# Patient Record
Sex: Male | Born: 1965 | Race: White | Hispanic: No | Marital: Married | Smoking: Former smoker
Health system: Southern US, Community
[De-identification: ages and names within clinical notes are randomized; demographics above are authoritative.]

## PROBLEM LIST (undated history)

## (undated) DIAGNOSIS — K219 Gastro-esophageal reflux disease without esophagitis: Secondary | ICD-10-CM

## (undated) DIAGNOSIS — Z8709 Personal history of other diseases of the respiratory system: Secondary | ICD-10-CM

## (undated) DIAGNOSIS — M1990 Unspecified osteoarthritis, unspecified site: Secondary | ICD-10-CM

## (undated) DIAGNOSIS — I2510 Atherosclerotic heart disease of native coronary artery without angina pectoris: Secondary | ICD-10-CM

## (undated) DIAGNOSIS — C8590 Non-Hodgkin lymphoma, unspecified, unspecified site: Secondary | ICD-10-CM

## (undated) DIAGNOSIS — M5136 Other intervertebral disc degeneration, lumbar region: Secondary | ICD-10-CM

## (undated) DIAGNOSIS — M5126 Other intervertebral disc displacement, lumbar region: Secondary | ICD-10-CM

## (undated) DIAGNOSIS — Z9221 Personal history of antineoplastic chemotherapy: Secondary | ICD-10-CM

## (undated) DIAGNOSIS — S069X9A Unspecified intracranial injury with loss of consciousness of unspecified duration, initial encounter: Secondary | ICD-10-CM

## (undated) DIAGNOSIS — I219 Acute myocardial infarction, unspecified: Secondary | ICD-10-CM

## (undated) DIAGNOSIS — S069XAA Unspecified intracranial injury with loss of consciousness status unknown, initial encounter: Secondary | ICD-10-CM

## (undated) DIAGNOSIS — R7303 Prediabetes: Secondary | ICD-10-CM

## (undated) DIAGNOSIS — E785 Hyperlipidemia, unspecified: Secondary | ICD-10-CM

## (undated) DIAGNOSIS — F419 Anxiety disorder, unspecified: Secondary | ICD-10-CM

## (undated) DIAGNOSIS — E039 Hypothyroidism, unspecified: Secondary | ICD-10-CM

## (undated) DIAGNOSIS — I10 Essential (primary) hypertension: Secondary | ICD-10-CM

## (undated) DIAGNOSIS — R200 Anesthesia of skin: Secondary | ICD-10-CM

## (undated) DIAGNOSIS — Z923 Personal history of irradiation: Secondary | ICD-10-CM

## (undated) DIAGNOSIS — C801 Malignant (primary) neoplasm, unspecified: Secondary | ICD-10-CM

## (undated) DIAGNOSIS — J449 Chronic obstructive pulmonary disease, unspecified: Secondary | ICD-10-CM

## (undated) DIAGNOSIS — M51369 Other intervertebral disc degeneration, lumbar region without mention of lumbar back pain or lower extremity pain: Secondary | ICD-10-CM

## (undated) HISTORY — PX: HERNIA REPAIR: SHX51

## (undated) HISTORY — PX: BACK SURGERY: SHX140

## (undated) HISTORY — PX: OTHER SURGICAL HISTORY: SHX169

---

## 1999-04-01 HISTORY — PX: LYMPH NODE BIOPSY: SHX201

## 2000-04-17 ENCOUNTER — Ambulatory Visit (HOSPITAL_COMMUNITY): Admission: RE | Admit: 2000-04-17 | Discharge: 2000-04-17 | Payer: Self-pay | Admitting: Oncology

## 2000-04-17 ENCOUNTER — Encounter: Payer: Self-pay | Admitting: Oncology

## 2000-04-21 ENCOUNTER — Encounter: Admission: RE | Admit: 2000-04-21 | Discharge: 2000-07-20 | Payer: Self-pay | Admitting: Radiation Oncology

## 2000-04-24 ENCOUNTER — Encounter (HOSPITAL_COMMUNITY): Admission: RE | Admit: 2000-04-24 | Discharge: 2000-07-23 | Payer: Self-pay | Admitting: Dentistry

## 2000-08-14 ENCOUNTER — Encounter: Payer: Self-pay | Admitting: Oncology

## 2000-08-14 ENCOUNTER — Ambulatory Visit (HOSPITAL_COMMUNITY): Admission: RE | Admit: 2000-08-14 | Discharge: 2000-08-14 | Payer: Self-pay | Admitting: Oncology

## 2000-11-11 ENCOUNTER — Encounter: Payer: Self-pay | Admitting: Oncology

## 2000-11-11 ENCOUNTER — Ambulatory Visit (HOSPITAL_COMMUNITY): Admission: RE | Admit: 2000-11-11 | Discharge: 2000-11-11 | Payer: Self-pay | Admitting: Oncology

## 2001-03-08 ENCOUNTER — Ambulatory Visit (HOSPITAL_COMMUNITY): Admission: RE | Admit: 2001-03-08 | Discharge: 2001-03-08 | Payer: Self-pay | Admitting: Oncology

## 2001-03-08 ENCOUNTER — Encounter: Payer: Self-pay | Admitting: Oncology

## 2001-09-24 ENCOUNTER — Encounter: Payer: Self-pay | Admitting: Oncology

## 2001-09-24 ENCOUNTER — Ambulatory Visit (HOSPITAL_COMMUNITY): Admission: RE | Admit: 2001-09-24 | Discharge: 2001-09-24 | Payer: Self-pay | Admitting: Oncology

## 2002-03-08 ENCOUNTER — Ambulatory Visit (HOSPITAL_COMMUNITY): Admission: RE | Admit: 2002-03-08 | Discharge: 2002-03-08 | Payer: Self-pay | Admitting: Hematology and Oncology

## 2002-03-08 ENCOUNTER — Encounter: Payer: Self-pay | Admitting: Hematology and Oncology

## 2002-08-23 ENCOUNTER — Ambulatory Visit (HOSPITAL_COMMUNITY): Admission: RE | Admit: 2002-08-23 | Discharge: 2002-08-23 | Payer: Self-pay | Admitting: *Deleted

## 2002-08-23 ENCOUNTER — Encounter: Payer: Self-pay | Admitting: *Deleted

## 2003-09-19 ENCOUNTER — Ambulatory Visit (HOSPITAL_COMMUNITY): Admission: RE | Admit: 2003-09-19 | Discharge: 2003-09-19 | Payer: Self-pay | Admitting: Hematology and Oncology

## 2008-12-26 ENCOUNTER — Ambulatory Visit (HOSPITAL_COMMUNITY): Admission: RE | Admit: 2008-12-26 | Discharge: 2008-12-26 | Payer: Self-pay | Admitting: Internal Medicine

## 2009-09-14 ENCOUNTER — Ambulatory Visit (HOSPITAL_COMMUNITY): Admission: RE | Admit: 2009-09-14 | Discharge: 2009-09-14 | Payer: Self-pay | Admitting: General Surgery

## 2010-06-16 LAB — BASIC METABOLIC PANEL
CO2: 25 mEq/L (ref 19–32)
Chloride: 106 mEq/L (ref 96–112)
Creatinine, Ser: 0.69 mg/dL (ref 0.4–1.5)
GFR calc non Af Amer: >60 mL/min (ref >60)
Sodium: 139 mEq/L (ref 135–145)

## 2010-06-16 LAB — CBC
HCT: 44.1 % (ref 39.0–52.0)
MCV: 90.1 fL (ref 78.0–100.0)
Platelets: 228 10*3/uL (ref 150–400)
RDW: 12.7 % (ref 11.5–15.5)
WBC: 8.8 10*3/uL (ref 4.0–10.5)

## 2010-06-16 LAB — SURGICAL PCR SCREEN: Staphylococcus aureus: NEGATIVE

## 2011-07-28 ENCOUNTER — Ambulatory Visit (HOSPITAL_COMMUNITY)
Admission: RE | Admit: 2011-07-28 | Discharge: 2011-07-28 | Disposition: A | Discharge status: Home / Self Care | Payer: Medicare Other | Source: Ambulatory Visit | Attending: Physical Medicine and Rehabilitation | Admitting: Physical Medicine and Rehabilitation

## 2011-07-28 ENCOUNTER — Other Ambulatory Visit (HOSPITAL_COMMUNITY): Payer: Self-pay | Admitting: Physical Medicine and Rehabilitation

## 2011-07-28 DIAGNOSIS — M5137 Other intervertebral disc degeneration, lumbosacral region: Secondary | ICD-10-CM | POA: Insufficient documentation

## 2011-07-28 DIAGNOSIS — M545 Low back pain, unspecified: Secondary | ICD-10-CM | POA: Insufficient documentation

## 2011-07-28 DIAGNOSIS — M79609 Pain in unspecified limb: Secondary | ICD-10-CM

## 2011-07-28 DIAGNOSIS — M51379 Other intervertebral disc degeneration, lumbosacral region without mention of lumbar back pain or lower extremity pain: Secondary | ICD-10-CM | POA: Insufficient documentation

## 2011-07-28 DIAGNOSIS — M47817 Spondylosis without myelopathy or radiculopathy, lumbosacral region: Secondary | ICD-10-CM

## 2011-07-28 IMAGING — CR DG LUMBAR SPINE COMPLETE 4+V
5 series · 5 of 5 positions shown · non-contrast
Comparison: None.

CLINICAL DATA: Low back pain, lifting injury

LUMBAR SPINE - COMPLETE 4+ VIEW

[view not recorded (1 of 5)]
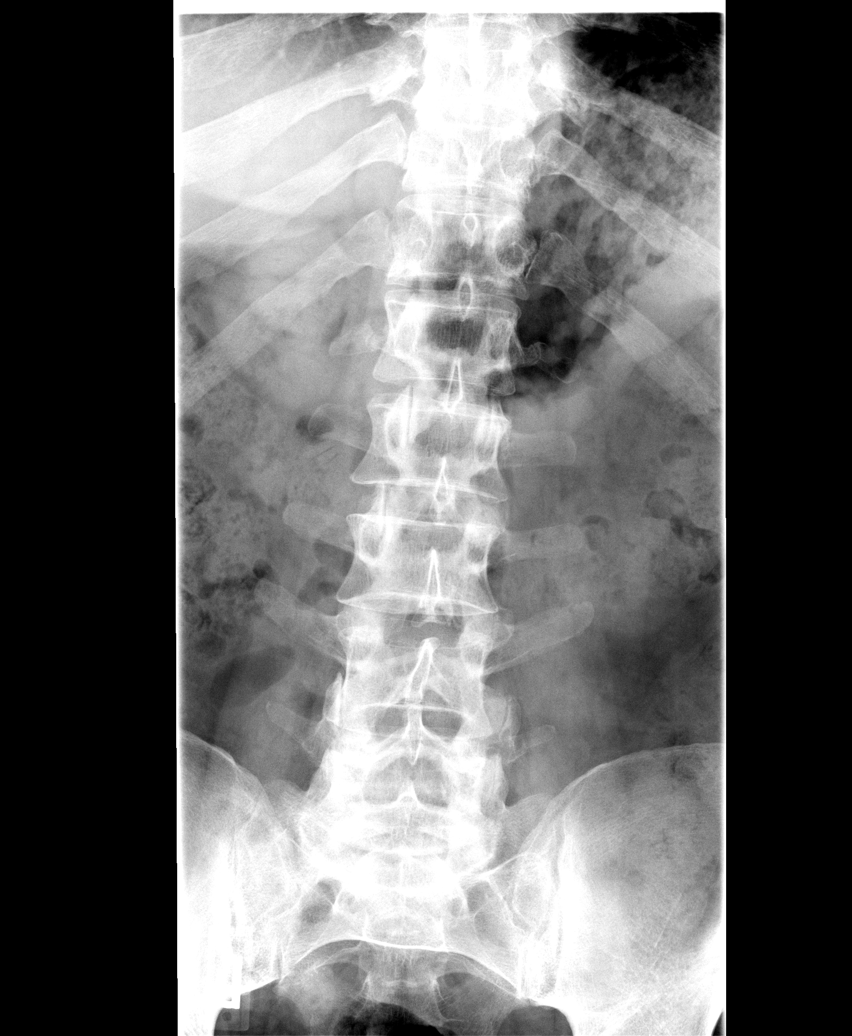

[view not recorded (2 of 5)]
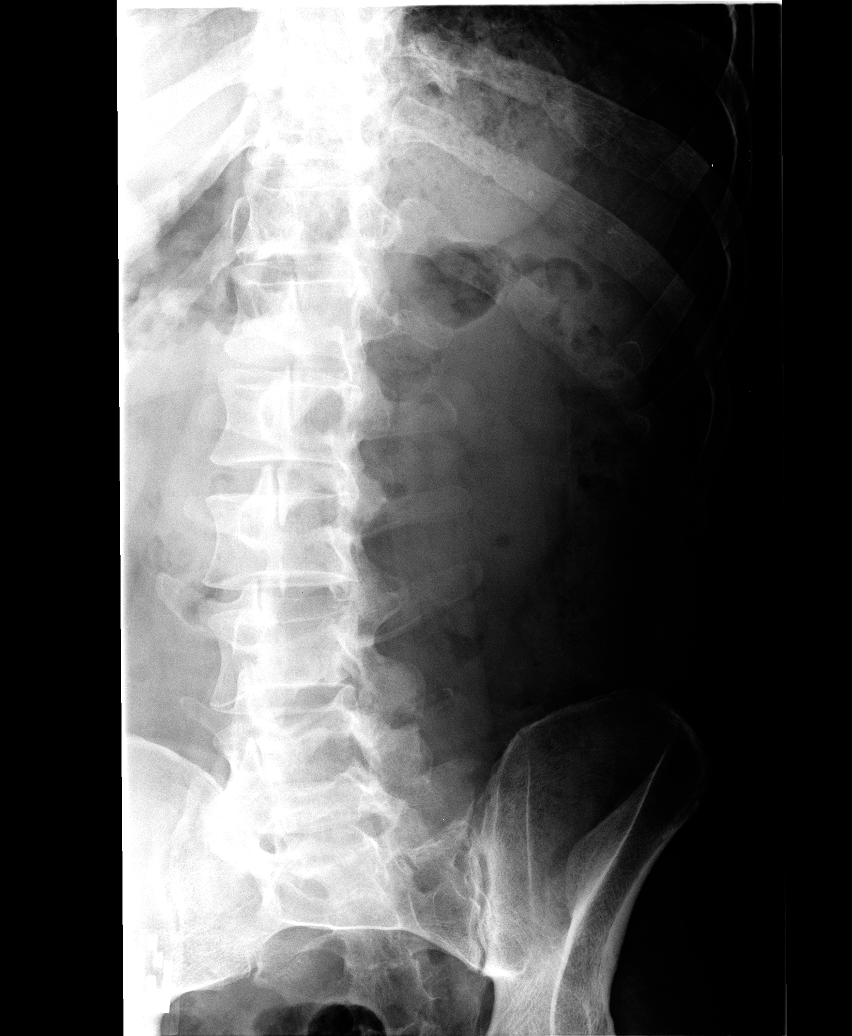

[view not recorded (3 of 5)]
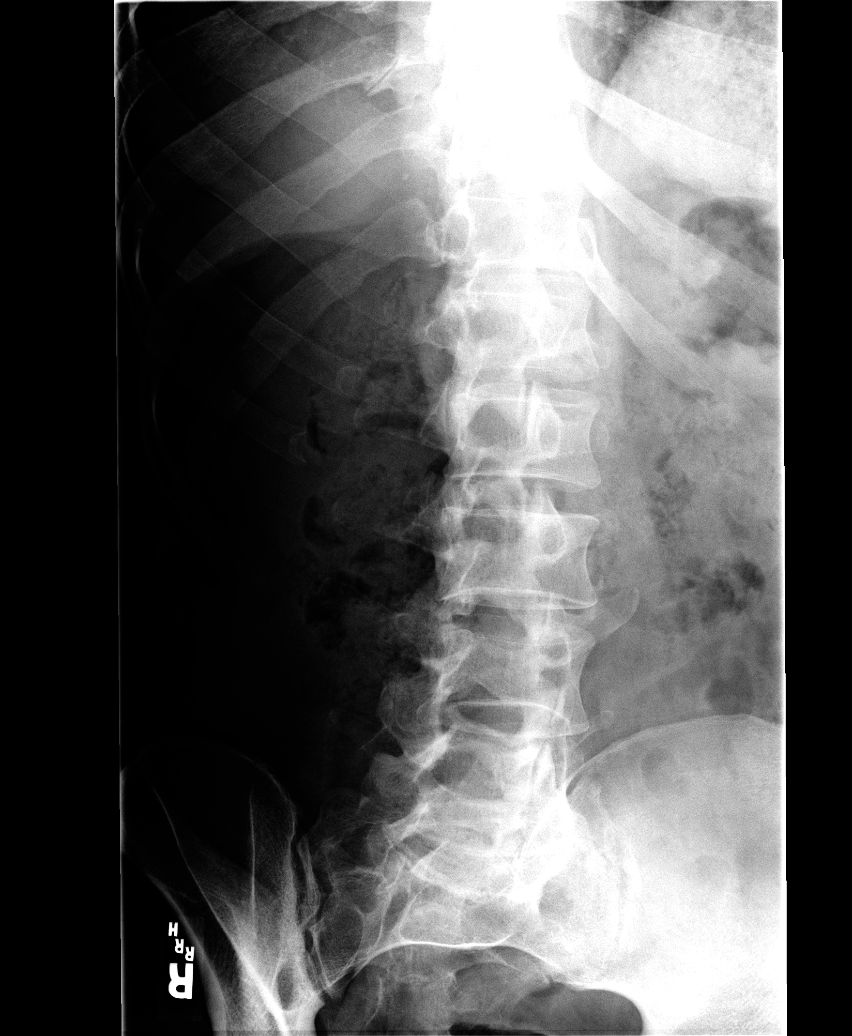

[view not recorded (4 of 5)]
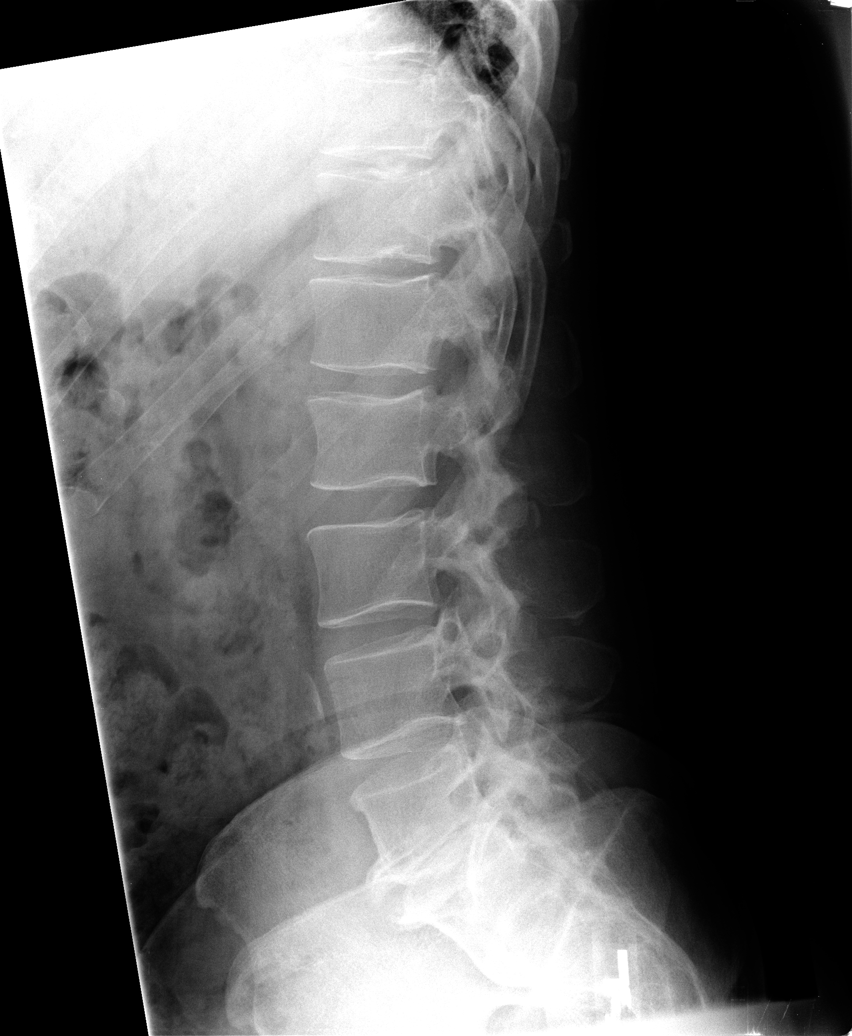

[view not recorded (5 of 5)]
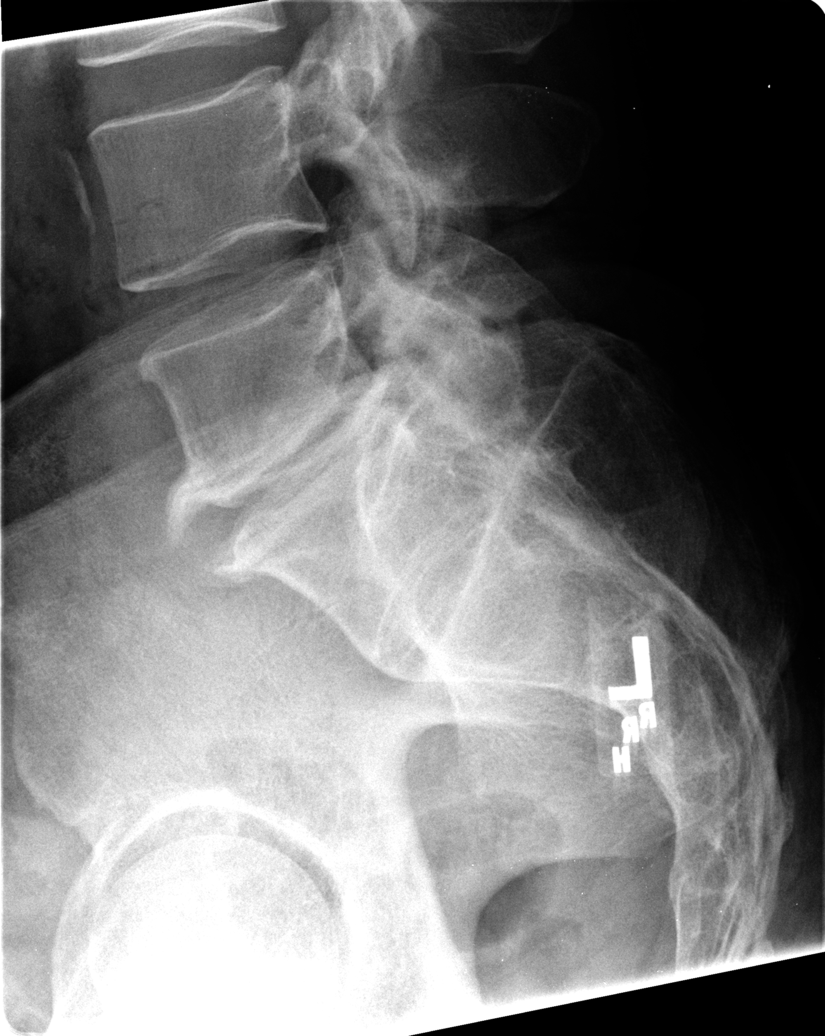

[5 of 5 positions shown; findings below may reference images not displayed]

FINDINGS: Five lumbar-type vertebral bodies.

No evidence of fracture or dislocation.  Vertebral body heights are
maintained.

Moderate degenerative changes of L5-S1.

Vascular calcifications.
IMPRESSION: No fracture or dislocation is seen.

Moderate degenerative changes of L5-S1.

## 2011-08-04 ENCOUNTER — Other Ambulatory Visit (HOSPITAL_COMMUNITY): Payer: Self-pay | Admitting: Physical Medicine and Rehabilitation

## 2011-08-04 DIAGNOSIS — R269 Unspecified abnormalities of gait and mobility: Secondary | ICD-10-CM

## 2011-08-04 DIAGNOSIS — G894 Chronic pain syndrome: Secondary | ICD-10-CM

## 2011-08-04 DIAGNOSIS — M545 Low back pain: Secondary | ICD-10-CM

## 2011-08-04 DIAGNOSIS — M47817 Spondylosis without myelopathy or radiculopathy, lumbosacral region: Secondary | ICD-10-CM

## 2011-08-05 ENCOUNTER — Ambulatory Visit (HOSPITAL_COMMUNITY)
Admission: RE | Admit: 2011-08-05 | Discharge: 2011-08-05 | Disposition: A | Discharge status: Home / Self Care | Payer: Medicare Other | Source: Ambulatory Visit | Attending: Physical Medicine and Rehabilitation | Admitting: Physical Medicine and Rehabilitation

## 2011-08-05 DIAGNOSIS — M47817 Spondylosis without myelopathy or radiculopathy, lumbosacral region: Secondary | ICD-10-CM | POA: Insufficient documentation

## 2011-08-05 DIAGNOSIS — R269 Unspecified abnormalities of gait and mobility: Secondary | ICD-10-CM | POA: Insufficient documentation

## 2011-08-05 DIAGNOSIS — M48061 Spinal stenosis, lumbar region without neurogenic claudication: Secondary | ICD-10-CM | POA: Insufficient documentation

## 2011-08-05 DIAGNOSIS — Z01812 Encounter for preprocedural laboratory examination: Secondary | ICD-10-CM | POA: Insufficient documentation

## 2011-08-05 DIAGNOSIS — G894 Chronic pain syndrome: Secondary | ICD-10-CM | POA: Insufficient documentation

## 2011-08-05 DIAGNOSIS — M545 Low back pain: Secondary | ICD-10-CM

## 2011-08-05 DIAGNOSIS — M5137 Other intervertebral disc degeneration, lumbosacral region: Secondary | ICD-10-CM | POA: Insufficient documentation

## 2011-08-05 DIAGNOSIS — M5126 Other intervertebral disc displacement, lumbar region: Secondary | ICD-10-CM | POA: Insufficient documentation

## 2011-08-05 DIAGNOSIS — M51379 Other intervertebral disc degeneration, lumbosacral region without mention of lumbar back pain or lower extremity pain: Secondary | ICD-10-CM | POA: Insufficient documentation

## 2011-08-05 LAB — POCT I-STAT, CHEM 8
Creatinine, Ser: 1.1 mg/dL (ref 0.50–1.35)
Sodium: 135 mEq/L (ref 135–145)
TCO2: 14 mmol/L (ref 0–100)

## 2011-08-05 MED ORDER — GADOBENATE DIMEGLUMINE 529 MG/ML IV SOLN
20.0000 mL | Freq: Once | INTRAVENOUS | Status: AC | PRN
  Administered 2011-08-05: 20 mL via INTRAVENOUS

## 2014-03-31 DIAGNOSIS — I219 Acute myocardial infarction, unspecified: Secondary | ICD-10-CM

## 2014-03-31 HISTORY — DX: Acute myocardial infarction, unspecified: I21.9

## 2014-03-31 HISTORY — PX: CARDIAC CATHETERIZATION: SHX172
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
## 2016-01-10 ENCOUNTER — Encounter (HOSPITAL_COMMUNITY)
Admission: RE | Admit: 2016-01-10 | Discharge: 2016-01-10 | Disposition: A | Discharge status: Home / Self Care | Payer: Medicare Other | Source: Ambulatory Visit | Attending: Orthopedic Surgery | Admitting: Orthopedic Surgery

## 2016-01-10 ENCOUNTER — Encounter (HOSPITAL_COMMUNITY): Payer: Self-pay

## 2016-01-10 DIAGNOSIS — Z0183 Encounter for blood typing: Secondary | ICD-10-CM | POA: Diagnosis not present

## 2016-01-10 DIAGNOSIS — Z01812 Encounter for preprocedural laboratory examination: Secondary | ICD-10-CM | POA: Diagnosis not present

## 2016-01-10 DIAGNOSIS — M1712 Unilateral primary osteoarthritis, left knee: Secondary | ICD-10-CM | POA: Insufficient documentation

## 2016-01-10 HISTORY — DX: Atherosclerotic heart disease of native coronary artery without angina pectoris: I25.10

## 2016-01-10 HISTORY — DX: Personal history of antineoplastic chemotherapy: Z92.21

## 2016-01-10 HISTORY — DX: Hypothyroidism, unspecified: E03.9

## 2016-01-10 HISTORY — DX: Unspecified intracranial injury with loss of consciousness of unspecified duration, initial encounter: S06.9X9A

## 2016-01-10 HISTORY — DX: Personal history of irradiation: Z92.3

## 2016-01-10 HISTORY — DX: Prediabetes: R73.03

## 2016-01-10 HISTORY — DX: Unspecified intracranial injury with loss of consciousness status unknown, initial encounter: S06.9XAA

## 2016-01-10 HISTORY — DX: Hyperlipidemia, unspecified: E78.5

## 2016-01-10 HISTORY — DX: Non-Hodgkin lymphoma, unspecified, unspecified site: C85.90

## 2016-01-10 HISTORY — DX: Anxiety disorder, unspecified: F41.9

## 2016-01-10 HISTORY — DX: Malignant (primary) neoplasm, unspecified: C80.1

## 2016-01-10 HISTORY — DX: Acute myocardial infarction, unspecified: I21.9

## 2016-01-10 HISTORY — DX: Essential (primary) hypertension: I10

## 2016-01-10 HISTORY — DX: Unspecified osteoarthritis, unspecified site: M19.90

## 2016-01-10 HISTORY — DX: Gastro-esophageal reflux disease without esophagitis: K21.9

## 2016-01-10 HISTORY — DX: Personal history of other diseases of the respiratory system: Z87.09

## 2016-01-10 HISTORY — DX: Anesthesia of skin: R20.0

## 2016-01-10 LAB — SURGICAL PCR SCREEN
MRSA, PCR: NEGATIVE
Staphylococcus aureus: NEGATIVE

## 2016-01-10 LAB — BASIC METABOLIC PANEL
Anion gap: 7 (ref 5–15)
BUN: 12 mg/dL (ref 6–20)
CHLORIDE: 106 mmol/L (ref 101–111)
CO2: 27 mmol/L (ref 22–32)
CREATININE: 0.79 mg/dL (ref 0.61–1.24)
Calcium: 9.2 mg/dL (ref 8.9–10.3)
GFR calc non Af Amer: >60 mL/min (ref >60)
Glucose, Bld: 84 mg/dL (ref 65–99)
POTASSIUM: 4 mmol/L (ref 3.5–5.1)
Sodium: 140 mmol/L (ref 135–145)

## 2016-01-10 LAB — CBC
HEMATOCRIT: 40 % (ref 39.0–52.0)
HEMOGLOBIN: 13.3 g/dL (ref 13.0–17.0)
MCH: 30.1 pg (ref 26.0–34.0)
MCHC: 33.3 g/dL (ref 30.0–36.0)
MCV: 90.5 fL (ref 78.0–100.0)
PLATELETS: 285 10*3/uL (ref 150–400)
RBC: 4.42 MIL/uL (ref 4.22–5.81)
RDW: 13 % (ref 11.5–15.5)
WBC: 6 10*3/uL (ref 4.0–10.5)

## 2016-01-10 LAB — TYPE AND SCREEN
ABO/RH(D): O POS
ANTIBODY SCREEN: NEGATIVE

## 2016-01-10 LAB — ABO/RH: ABO/RH(D): O POS

## 2016-01-10 NOTE — Patient Instructions (Signed)
Brett Olson  01/10/2016   Your procedure is scheduled on: Monday January 21, 2016  Report to [facility]  Entrance take [place]  elevators to 3rd floor to  [name] at 5:30 AM.  Call this number if you have problems the morning of surgery [phone]   Remember: ONLY 1 PERSON MAY GO WITH YOU TO SHORT STAY TO GET  READY MORNING OF [name].  Do not eat food or drink liquids :After Midnight.     Take these medicines the morning of surgery with A SIP OF WATER: Carvedilol (Coreg); Cetirizine (Zyrtec); Nexium; Levothyroxine; Singulair                                You may not have any metal on your body including hair pins and              piercings  Do not wear jewelry, lotions, powders or colognes, deodorant                     Men may shave face and neck.   Do not bring valuables to the hospital. [place].  Contacts, dentures or bridgework may not be worn into surgery.  Leave suitcase in the car. After surgery it may be brought to your room.                Please read over the following fact sheets you were given:MRSA INFORMATION SHEET; INCENTIVE SPIROMETER; BLOOD TRANSFUSION INFORMATION SHEET  _____________________________________________________________________             [facility] - Preparing for Surgery Before surgery, you can play an important role.  Because skin is not sterile, your skin needs to be as free of germs as possible.  You can reduce the number of germs on your skin by washing with CHG (chlorahexidine gluconate) soap before surgery.  CHG is an antiseptic cleaner which kills germs and bonds with the skin to continue killing germs even after washing. Please DO NOT use if you have an allergy to CHG or antibacterial soaps.  If your skin becomes reddened/irritated stop using the CHG and inform your nurse when you arrive at Short Stay. Do not shave (including legs and  underarms) for at least 48 hours prior to the first CHG shower.  You may shave your face/neck. Please follow these instructions carefully:  1.  Shower with CHG Soap the night before surgery and the  morning of Surgery.  2.  If you choose to wash your hair, wash your hair first as usual with your  normal  shampoo.  3.  After you shampoo, rinse your hair and body thoroughly to remove the  shampoo.                           4.  Use CHG as you would any other liquid soap.  You can apply chg directly  to the skin and wash                       Gently with a scrungie or clean washcloth.  5.  Apply the CHG Soap to your body ONLY FROM  THE NECK DOWN.   Do not use on face/ open                           Wound or open sores. Avoid contact with eyes, ears mouth and genitals (private parts).                       Wash face,  Genitals (private parts) with your normal soap.             6.  Wash thoroughly, paying special attention to the area where your surgery  will be performed.  7.  Thoroughly rinse your body with warm water from the neck down.  8.  DO NOT shower/wash with your normal soap after using and rinsing off  the CHG Soap.                9.  Pat yourself dry with a clean towel.            10.  Wear clean pajamas.            11.  Place clean sheets on your bed the night of your first shower and do not  sleep with pets. Day of Surgery : Do not apply any lotions/deodorants the morning of surgery.  Please wear clean clothes to the hospital/surgery center.  FAILURE TO FOLLOW THESE INSTRUCTIONS MAY RESULT IN THE CANCELLATION OF YOUR SURGERY PATIENT SIGNATURE_________________________________  NURSE SIGNATURE__________________________________  ________________________________________________________________________   Brett Olson  An incentive spirometer is a tool that can help keep your lungs clear and active. This tool measures how well you are filling your lungs with each breath. Taking  long deep breaths may help reverse or decrease the chance of developing breathing (pulmonary) problems (especially infection) following:  A long period of time when you are unable to move or be active. BEFORE THE PROCEDURE   If the spirometer includes an indicator to show your best effort, your nurse or respiratory therapist will set it to a desired goal.  If possible, sit up straight or lean slightly forward. Try not to slouch.  Hold the incentive spirometer in an upright position. INSTRUCTIONS FOR USE  1. Sit on the edge of your bed if possible, or sit up as far as you can in bed or on a chair. 2. Hold the incentive spirometer in an upright position. 3. Breathe out normally. 4. Place the mouthpiece in your mouth and seal your lips tightly around it. 5. Breathe in slowly and as deeply as possible, raising the piston or the ball toward the top of the column. 6. Hold your breath for 3-5 seconds or for as long as possible. Allow the piston or ball to fall to the bottom of the column. 7. Remove the mouthpiece from your mouth and breathe out normally. 8. Rest for a few seconds and repeat Steps 1 through 7 at least 10 times every 1-2 hours when you are awake. Take your time and take a few normal breaths between deep breaths. 9. The spirometer may include an indicator to show your best effort. Use the indicator as a goal to work toward during each repetition. 10. After each set of 10 deep breaths, practice coughing to be sure your lungs are clear. If you have an incision (the cut made at the time of surgery), support your incision when coughing by placing a pillow or rolled up towels firmly against it. Once you are  able to get out of bed, walk around indoors and cough well. You may stop using the incentive spirometer when instructed by your caregiver.  RISKS AND COMPLICATIONS  Take your time so you do not get dizzy or light-headed.  If you are in pain, you may need to take or ask for pain  medication before doing incentive spirometry. It is harder to take a deep breath if you are having pain. AFTER USE  Rest and breathe slowly and easily.  It can be helpful to keep track of a log of your progress. Your caregiver can provide you with a simple table to help with this. If you are using the spirometer at home, follow these instructions: [place] IF:   You are having difficultly using the spirometer.  You have trouble using the spirometer as often as instructed.  Your pain medication is not giving enough relief while using the spirometer.  You develop fever of 100.5 F (38.1 C) or higher. SEEK IMMEDIATE MEDICAL CARE IF:   You cough up bloody sputum that had not been present before.  You develop fever of 102 F (38.9 C) or greater.  You develop worsening pain at or near the incision site. MAKE SURE YOU:   Understand these instructions.  Will watch your condition.  Will get help right away if you are not doing well or get worse. Document Released: 07/28/2006 Document Revised: 06/09/2011 Document Reviewed: 09/28/2006 ExitCare Patient Information 2014 ExitCare, [place].   ________________________________________________________________________  WHAT IS A BLOOD TRANSFUSION? Blood Transfusion Information  A transfusion is the replacement of blood or some of its parts. Blood is made up of multiple cells which provide different functions.  Red blood cells carry oxygen and are used for blood loss replacement.  White blood cells fight against infection.  Platelets control bleeding.  Plasma helps clot blood.  Other blood products are available for specialized needs, such as hemophilia or other clotting disorders. BEFORE THE TRANSFUSION  Who gives blood for transfusions?   Healthy volunteers who are fully evaluated to make sure their blood is safe. This is blood bank blood. Transfusion therapy is the safest it has ever been in the practice of medicine.  Before blood is taken from a donor, a complete history is taken to make sure that person has no history of diseases nor engages in risky social behavior (examples are intravenous drug use or sexual activity with multiple partners). The donor's travel history is screened to minimize risk of transmitting infections, such as malaria. The donated blood is tested for signs of infectious diseases, such as HIV and hepatitis. The blood is then tested to be sure it is compatible with you in order to minimize the chance of a transfusion reaction. If you or a relative donates blood, this is often done in anticipation of surgery and is not appropriate for emergency situations. It takes many days to process the donated blood. RISKS AND COMPLICATIONS Although transfusion therapy is very safe and saves many lives, the main dangers of transfusion include:   Getting an infectious disease.  Developing a transfusion reaction. This is an allergic reaction to something in the blood you were given. Every precaution is taken to prevent this. The decision to have a blood transfusion has been considered carefully by your caregiver before blood is given. Blood is not given unless the benefits outweigh the risks. AFTER THE TRANSFUSION  Right after receiving a blood transfusion, you will usually feel much better and more energetic. This is especially  true if your red blood cells have gotten low (anemic). The transfusion raises the level of the red blood cells which carry oxygen, and this usually causes an energy increase.  The nurse administering the transfusion will monitor you carefully for complications. HOME CARE INSTRUCTIONS  No special instructions are needed after a transfusion. You may find your energy is better. Speak with your caregiver about any limitations on activity for underlying diseases you may have. SEEK MEDICAL CARE IF:   Your condition is not improving after your transfusion.  You develop redness or  irritation at the intravenous (IV) site. SEEK IMMEDIATE MEDICAL CARE IF:  Any of the following symptoms occur over the next 12 hours:  Shaking chills.  You have a temperature by mouth above 102 F (38.9 C), not controlled by medicine.  Chest, back, or muscle pain.  People around you feel you are not acting correctly or are confused.  Shortness of breath or difficulty breathing.  Dizziness and fainting.  You get a rash or develop hives.  You have a decrease in urine output.  Your urine turns a dark color or changes to pink, red, or brown. Any of the following symptoms occur over the next 10 days:  You have a temperature by mouth above 102 F (38.9 C), not controlled by medicine.  Shortness of breath.  Weakness after normal activity.  The white part of the eye turns yellow (jaundice).  You have a decrease in the amount of urine or are urinating less often.  Your urine turns a dark color or changes to pink, red, or brown. Document Released: 03/14/2000 Document Revised: 06/09/2011 Document Reviewed: 11/01/2007 [facility] Patient Information 2014 [place], [place].  _______________________________________________________________________

## 2016-01-10 NOTE — Progress Notes (Addendum)
Clearance note per Dr Sabra Heck 12/20/2015 H&P per chart 12/20/2015 per Dr Sabra Heck Stress report per chart 10/26/2014

## 2016-01-11 LAB — HEMOGLOBIN A1C
Hgb A1c MFr Bld: 5.6 % (ref 4.8–5.6)
MEAN PLASMA GLUCOSE: 114 mg/dL

## 2016-01-21 ENCOUNTER — Encounter (HOSPITAL_COMMUNITY)
Admission: RE | Disposition: A | Discharge status: Home / Self Care | Payer: Self-pay | Source: Ambulatory Visit | Attending: Orthopedic Surgery

## 2016-01-21 ENCOUNTER — Ambulatory Visit (HOSPITAL_COMMUNITY): Payer: Medicare Other | Admitting: Certified Registered Nurse Anesthetist

## 2016-01-21 ENCOUNTER — Encounter (HOSPITAL_COMMUNITY): Payer: Self-pay | Admitting: *Deleted

## 2016-01-21 ENCOUNTER — Ambulatory Visit (HOSPITAL_COMMUNITY)
Admission: RE | Admit: 2016-01-21 | Discharge: 2016-01-21 | Disposition: A | Discharge status: Home / Self Care | Payer: Medicare Other | Source: Ambulatory Visit | Attending: Orthopedic Surgery | Admitting: Orthopedic Surgery

## 2016-01-21 DIAGNOSIS — Z923 Personal history of irradiation: Secondary | ICD-10-CM | POA: Insufficient documentation

## 2016-01-21 DIAGNOSIS — I10 Essential (primary) hypertension: Secondary | ICD-10-CM | POA: Diagnosis not present

## 2016-01-21 DIAGNOSIS — Z8572 Personal history of non-Hodgkin lymphomas: Secondary | ICD-10-CM | POA: Insufficient documentation

## 2016-01-21 DIAGNOSIS — I252 Old myocardial infarction: Secondary | ICD-10-CM | POA: Diagnosis not present

## 2016-01-21 DIAGNOSIS — E785 Hyperlipidemia, unspecified: Secondary | ICD-10-CM | POA: Insufficient documentation

## 2016-01-21 DIAGNOSIS — Z87891 Personal history of nicotine dependence: Secondary | ICD-10-CM | POA: Insufficient documentation

## 2016-01-21 DIAGNOSIS — R7303 Prediabetes: Secondary | ICD-10-CM | POA: Insufficient documentation

## 2016-01-21 DIAGNOSIS — F419 Anxiety disorder, unspecified: Secondary | ICD-10-CM | POA: Diagnosis not present

## 2016-01-21 DIAGNOSIS — Z87820 Personal history of traumatic brain injury: Secondary | ICD-10-CM | POA: Insufficient documentation

## 2016-01-21 DIAGNOSIS — M1712 Unilateral primary osteoarthritis, left knee: Secondary | ICD-10-CM | POA: Insufficient documentation

## 2016-01-21 DIAGNOSIS — E039 Hypothyroidism, unspecified: Secondary | ICD-10-CM | POA: Diagnosis not present

## 2016-01-21 DIAGNOSIS — I2510 Atherosclerotic heart disease of native coronary artery without angina pectoris: Secondary | ICD-10-CM | POA: Diagnosis not present

## 2016-01-21 DIAGNOSIS — Z9221 Personal history of antineoplastic chemotherapy: Secondary | ICD-10-CM | POA: Insufficient documentation

## 2016-01-21 DIAGNOSIS — Z96652 Presence of left artificial knee joint: Secondary | ICD-10-CM

## 2016-01-21 DIAGNOSIS — K219 Gastro-esophageal reflux disease without esophagitis: Secondary | ICD-10-CM | POA: Diagnosis not present

## 2016-01-21 HISTORY — PX: PARTIAL KNEE ARTHROPLASTY: SHX2174

## 2016-01-21 LAB — PROTIME-INR
INR: 0.92
PROTHROMBIN TIME: 12.4 s (ref 11.4–15.2)

## 2016-01-21 SURGERY — ARTHROPLASTY, KNEE, UNICOMPARTMENTAL
Anesthesia: General | Site: Knee | Laterality: Left

## 2016-01-21 MED ORDER — DEXAMETHASONE SODIUM PHOSPHATE 10 MG/ML IJ SOLN
10.0000 mg | Freq: Once | INTRAMUSCULAR | Status: AC
  Administered 2016-01-21: 10 mg via INTRAVENOUS

## 2016-01-21 MED ORDER — CHLORHEXIDINE GLUCONATE 4 % EX LIQD: 60.0000 mL | Freq: Once | CUTANEOUS | Status: DC

## 2016-01-21 MED ORDER — METHOCARBAMOL 500 MG PO TABS: 500.0000 mg | ORAL_TABLET | Freq: Four times a day (QID) | ORAL | Status: DC | PRN

## 2016-01-21 MED ORDER — STERILE WATER FOR IRRIGATION IR SOLN
Status: DC | PRN
  Administered 2016-01-21: 1500 mL

## 2016-01-21 MED ORDER — DIPHENHYDRAMINE HCL 25 MG PO CAPS
25.0000 mg | ORAL_CAPSULE | Freq: Four times a day (QID) | ORAL | Status: DC | PRN
  Filled 2016-01-21: qty 1

## 2016-01-21 MED ORDER — MIDAZOLAM HCL 2 MG/2ML IJ SOLN
INTRAMUSCULAR | Status: AC
  Filled 2016-01-21: qty 2

## 2016-01-21 MED ORDER — PROPOFOL 10 MG/ML IV BOLUS
INTRAVENOUS | Status: AC
  Filled 2016-01-21: qty 20

## 2016-01-21 MED ORDER — FENTANYL CITRATE (PF) 100 MCG/2ML IJ SOLN
INTRAMUSCULAR | Status: AC
  Filled 2016-01-21: qty 2

## 2016-01-21 MED ORDER — ESOMEPRAZOLE MAGNESIUM 40 MG PO CPDR
40.0000 mg | DELAYED_RELEASE_CAPSULE | Freq: Every day | ORAL | Status: DC
  Filled 2016-01-21 (×2): qty 1

## 2016-01-21 MED ORDER — TRANEXAMIC ACID 1000 MG/10ML IV SOLN
1000.0000 mg | INTRAVENOUS | Status: AC
  Administered 2016-01-21: 1000 mg via INTRAVENOUS
  Filled 2016-01-21: qty 1100

## 2016-01-21 MED ORDER — METHOCARBAMOL 500 MG PO TABS: 500.0000 mg | ORAL_TABLET | Freq: Four times a day (QID) | ORAL | 0 refills | Status: DC | PRN

## 2016-01-21 MED ORDER — CEFAZOLIN SODIUM-DEXTROSE 2-4 GM/100ML-% IV SOLN
2.0000 g | INTRAVENOUS | Status: AC
  Administered 2016-01-21: 2 g via INTRAVENOUS

## 2016-01-21 MED ORDER — LIDOCAINE 2% (20 MG/ML) 5 ML SYRINGE
INTRAMUSCULAR | Status: DC | PRN
Start: 2016-01-21 — End: 2016-01-21
  Administered 2016-01-21: 100 mg via INTRAVENOUS

## 2016-01-21 MED ORDER — EPHEDRINE SULFATE-NACL 50-0.9 MG/10ML-% IV SOSY
PREFILLED_SYRINGE | INTRAVENOUS | Status: DC | PRN
  Administered 2016-01-21: 10 mg via INTRAVENOUS

## 2016-01-21 MED ORDER — SODIUM CHLORIDE 0.9 % IJ SOLN
INTRAMUSCULAR | Status: DC | PRN
  Administered 2016-01-21: 30 mL

## 2016-01-21 MED ORDER — NON FORMULARY: 40.0000 mg | Freq: Every day | Status: DC

## 2016-01-21 MED ORDER — ONDANSETRON HCL 4 MG/2ML IJ SOLN
INTRAMUSCULAR | Status: DC | PRN
  Administered 2016-01-21: 4 mg via INTRAVENOUS

## 2016-01-21 MED ORDER — PROPOFOL 500 MG/50ML IV EMUL
INTRAVENOUS | Status: DC | PRN
  Administered 2016-01-21: 200 mg via INTRAVENOUS

## 2016-01-21 MED ORDER — ONDANSETRON HCL 4 MG/2ML IJ SOLN: 4.0000 mg | Freq: Four times a day (QID) | INTRAMUSCULAR | Status: DC | PRN

## 2016-01-21 MED ORDER — BUPIVACAINE HCL (PF) 0.25 % IJ SOLN
INTRAMUSCULAR | Status: AC
  Filled 2016-01-21: qty 30

## 2016-01-21 MED ORDER — KETOROLAC TROMETHAMINE 30 MG/ML IJ SOLN
INTRAMUSCULAR | Status: DC | PRN
  Administered 2016-01-21: 30 mg

## 2016-01-21 MED ORDER — SODIUM CHLORIDE 0.9 % IV SOLN
1000.0000 mg | Freq: Once | INTRAVENOUS | Status: AC
  Administered 2016-01-21: 1000 mg via INTRAVENOUS
  Filled 2016-01-21: qty 1100
  Filled 2016-01-21 (×2): qty 10

## 2016-01-21 MED ORDER — OXYCODONE HCL 5 MG/5ML PO SOLN
5.0000 mg | Freq: Once | ORAL | Status: DC | PRN
  Filled 2016-01-21: qty 5

## 2016-01-21 MED ORDER — CEPHALEXIN 500 MG PO CAPS: 500.0000 mg | ORAL_CAPSULE | Freq: Three times a day (TID) | ORAL | 0 refills | Status: DC

## 2016-01-21 MED ORDER — CEFAZOLIN SODIUM-DEXTROSE 2-4 GM/100ML-% IV SOLN
INTRAVENOUS | Status: AC
  Filled 2016-01-21: qty 100

## 2016-01-21 MED ORDER — OXYCODONE HCL 5 MG PO TABS: 5.0000 mg | ORAL_TABLET | Freq: Once | ORAL | Status: DC | PRN

## 2016-01-21 MED ORDER — BUPIVACAINE HCL (PF) 0.25 % IJ SOLN
INTRAMUSCULAR | Status: DC | PRN
  Administered 2016-01-21: 30 mL

## 2016-01-21 MED ORDER — FENTANYL CITRATE (PF) 100 MCG/2ML IJ SOLN
INTRAMUSCULAR | Status: DC | PRN
  Administered 2016-01-21 (×2): 100 ug via INTRAVENOUS

## 2016-01-21 MED ORDER — HYDROCODONE-ACETAMINOPHEN 7.5-325 MG PO TABS: 1.0000 | ORAL_TABLET | ORAL | 0 refills | Status: DC | PRN

## 2016-01-21 MED ORDER — SODIUM CHLORIDE 0.9 % IJ SOLN
INTRAMUSCULAR | Status: AC
  Filled 2016-01-21: qty 50

## 2016-01-21 MED ORDER — PROPOFOL 10 MG/ML IV BOLUS
INTRAVENOUS | Status: AC
  Filled 2016-01-21: qty 40

## 2016-01-21 MED ORDER — MIDAZOLAM HCL 5 MG/5ML IJ SOLN
INTRAMUSCULAR | Status: DC | PRN
  Administered 2016-01-21: 2 mg via INTRAVENOUS

## 2016-01-21 MED ORDER — KETOROLAC TROMETHAMINE 30 MG/ML IJ SOLN
INTRAMUSCULAR | Status: AC
  Filled 2016-01-21: qty 1

## 2016-01-21 MED ORDER — EPHEDRINE SULFATE 50 MG/ML IJ SOLN
INTRAMUSCULAR | Status: DC | PRN
  Administered 2016-01-21: 10 mg via INTRAVENOUS

## 2016-01-21 MED ORDER — SODIUM CHLORIDE 0.9 % IV BOLUS (SEPSIS)
500.0000 mL | Freq: Once | INTRAVENOUS | Status: AC
  Administered 2016-01-21: 500 mL via INTRAVENOUS

## 2016-01-21 MED ORDER — SUCCINYLCHOLINE CHLORIDE 20 MG/ML IJ SOLN
INTRAMUSCULAR | Status: DC | PRN
  Administered 2016-01-21: 120 mg via INTRAVENOUS

## 2016-01-21 MED ORDER — HYDROCODONE-ACETAMINOPHEN 7.5-325 MG PO TABS: 1.0000 | ORAL_TABLET | ORAL | Status: DC | PRN

## 2016-01-21 MED ORDER — 0.9 % SODIUM CHLORIDE (POUR BTL) OPTIME
TOPICAL | Status: DC | PRN
  Administered 2016-01-21: 1000 mL

## 2016-01-21 MED ORDER — HYDROMORPHONE HCL 1 MG/ML IJ SOLN: 0.2500 mg | INTRAMUSCULAR | Status: DC | PRN

## 2016-01-21 MED ORDER — METHOCARBAMOL 1000 MG/10ML IJ SOLN
500.0000 mg | Freq: Four times a day (QID) | INTRAVENOUS | Status: DC | PRN
  Administered 2016-01-21: 500 mg via INTRAVENOUS
  Filled 2016-01-21: qty 5
  Filled 2016-01-21: qty 550

## 2016-01-21 MED ORDER — LACTATED RINGERS IV SOLN
INTRAVENOUS | Status: DC | PRN
  Administered 2016-01-21 (×2): via INTRAVENOUS

## 2016-01-21 SURGICAL SUPPLY — 39 items
BAG DECANTER FOR FLEXI CONT (MISCELLANEOUS) IMPLANT
BAG ZIPLOCK 12X15 (MISCELLANEOUS) IMPLANT
BANDAGE ACE 6X5 VEL STRL LF (GAUZE/BANDAGES/DRESSINGS) ×3 IMPLANT
BLADE SAW RECIPROCATING 77.5 (BLADE) ×3 IMPLANT
BLADE SAW SGTL 13.0X1.19X90.0M (BLADE) ×3 IMPLANT
BONE CEMENT GENTAMICIN (Cement) ×3 IMPLANT
BOWL SMART MIX CTS (DISPOSABLE) ×3 IMPLANT
CAPT KNEE PARTIAL 2 ×3 IMPLANT
CEMENT BONE GENTAMICIN 40 (Cement) ×1 IMPLANT
CLOTH BEACON ORANGE TIMEOUT ST (SAFETY) ×3 IMPLANT
CUFF TOURN SGL QUICK 34 (TOURNIQUET CUFF) ×2
CUFF TRNQT CYL 34X4X40X1 (TOURNIQUET CUFF) ×1 IMPLANT
DERMABOND ADVANCED (GAUZE/BANDAGES/DRESSINGS) ×2
DERMABOND ADVANCED .7 DNX12 (GAUZE/BANDAGES/DRESSINGS) ×1 IMPLANT
DRAPE U-SHAPE 47X51 STRL (DRAPES) ×3 IMPLANT
DRSG AQUACEL AG ADV 3.5X10 (GAUZE/BANDAGES/DRESSINGS) ×3 IMPLANT
DURAPREP 26ML APPLICATOR (WOUND CARE) ×6 IMPLANT
ELECT REM PT RETURN 9FT ADLT (ELECTROSURGICAL) ×3
ELECTRODE REM PT RTRN 9FT ADLT (ELECTROSURGICAL) ×1 IMPLANT
GLOVE BIOGEL M 7.0 STRL (GLOVE) IMPLANT
GLOVE BIOGEL PI IND STRL 7.5 (GLOVE) ×7 IMPLANT
GLOVE BIOGEL PI IND STRL 8.5 (GLOVE) IMPLANT
GLOVE BIOGEL PI INDICATOR 7.5 (GLOVE) ×14
GLOVE BIOGEL PI INDICATOR 8.5 (GLOVE)
GLOVE ECLIPSE 8.0 STRL XLNG CF (GLOVE) ×3 IMPLANT
GLOVE ORTHO TXT STRL SZ7.5 (GLOVE) ×6 IMPLANT
GOWN STRL REUS W/TWL LRG LVL3 (GOWN DISPOSABLE) ×6 IMPLANT
GOWN STRL REUS W/TWL XL LVL3 (GOWN DISPOSABLE) ×6 IMPLANT
LEGGING LITHOTOMY PAIR STRL (DRAPES) ×3 IMPLANT
MANIFOLD NEPTUNE II (INSTRUMENTS) ×3 IMPLANT
PACK TOTAL KNEE CUSTOM (KITS) ×3 IMPLANT
SUT MNCRL AB 4-0 PS2 18 (SUTURE) ×3 IMPLANT
SUT VIC AB 1 CT1 36 (SUTURE) ×3 IMPLANT
SUT VIC AB 2-0 CT1 27 (SUTURE) ×4
SUT VIC AB 2-0 CT1 TAPERPNT 27 (SUTURE) ×2 IMPLANT
SUT VLOC 180 0 24IN GS25 (SUTURE) ×3 IMPLANT
SYR 50ML LL SCALE MARK (SYRINGE) IMPLANT
TRAY FOLEY W/METER SILVER 16FR (SET/KITS/TRAYS/PACK) IMPLANT
WRAP KNEE MAXI GEL POST OP (GAUZE/BANDAGES/DRESSINGS) ×3 IMPLANT
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
## 2016-01-21 NOTE — Interval H&P Note (Signed)
History and Physical Interval Note:  01/21/2016 7:20 AM  Brett Olson  has presented today for surgery, with the diagnosis of left knee osteoarthritis  The various methods of treatment have been discussed with the patient and family. After consideration of risks, benefits and other options for treatment, the patient has consented to  Procedure(s): LEFT UNICOMPARTMENTAL KNEE (Left) as a surgical intervention .  The patient's history has been reviewed, patient examined, no change in status, stable for surgery.  I have reviewed the patient's chart and labs.  Questions were answered to the patient's satisfaction.     Mauri Pole

## 2016-01-21 NOTE — Anesthesia Preprocedure Evaluation (Signed)
Anesthesia Evaluation  Patient identified by MRN, date of birth, ID band Patient awake    Reviewed: Allergy & Precautions, H&P , NPO status , Patient's Chart, lab work & pertinent test results  Airway Mallampati: II   Neck ROM: full    Dental   Pulmonary former smoker,    breath sounds clear to auscultation       Cardiovascular hypertension, + CAD and + Past MI   Rhythm:regular Rate:Normal     Neuro/Psych Anxiety    GI/Hepatic GERD  ,  Endo/Other  Hypothyroidism   Renal/GU      Musculoskeletal  (+) Arthritis ,   Abdominal   Peds  Hematology   Anesthesia Other Findings   Reproductive/Obstetrics                             Anesthesia Physical Anesthesia Plan  ASA: III  Anesthesia Plan: General   Post-op Pain Management:    Induction: Intravenous  Airway Management Planned: Oral ETT  Additional Equipment:   Intra-op Plan:   Post-operative Plan: Extubation in OR  Informed Consent: I have reviewed the patients History and Physical, chart, labs and discussed the procedure including the risks, benefits and alternatives for the proposed anesthesia with the patient or authorized representative who has indicated his/her understanding and acceptance.     Plan Discussed with: CRNA, Anesthesiologist and Surgeon  Anesthesia Plan Comments:         Anesthesia Quick Evaluation

## 2016-01-21 NOTE — Anesthesia Postprocedure Evaluation (Signed)
Anesthesia Post Note  Patient: Brett Olson  Procedure(s) Performed: Procedure(s) (LRB): LEFT UNICOMPARTMENTAL KNEE (Left)  Patient location during evaluation: PACU Anesthesia Type: General Level of consciousness: awake and alert and patient cooperative Pain management: pain level controlled Vital Signs Assessment: post-procedure vital signs reviewed and stable Respiratory status: spontaneous breathing and respiratory function stable Cardiovascular status: stable Anesthetic complications: no    Last Vitals:  Vitals:   01/21/16 1020 01/21/16 1032  BP:  122/79  Pulse: 80 82  Resp: (!) 21 16  Temp:  36.9 C    Last Pain:  Vitals:   01/21/16 1032  TempSrc:   PainSc: 0-No pain                 Callaghan Laverdure S

## 2016-01-21 NOTE — Op Note (Signed)
NAME: Brett Olson    MEDICAL RECORD NO.: [id number]   FACILITY: [facility] BIRTH: [birth year]  PHYSICIAN: Pietro Cassis. Alvan Dame, M.D.    DATE OF PROCEDURE: 01/21/2016    OPERATIVE REPORT   PREOPERATIVE DIAGNOSIS: Left knee medial compartment osteoarthritis.   POSTOPERATIVE DIAGNOSIS: Left knee medial compartment osteoarthritis.  PROCEDURE: Left partial knee replacement utilizing Biomet Oxford knee  component, size meidum femur, a left medial size C tibial tray with a size 4 mm insert.   SURGEON: Pietro Cassis. Alvan Dame, M.D.   ASSISTANT: Molli Barrows, PAC.  Please note that Brett Olson was present for the entirety of the case,  utilized for preoperative positioning, perioperative retractor  management, general facilitation of the case and primary wound closure.   ANESTHESIA: GET.   SPECIMENS: None.   COMPLICATIONS: None.  DRAINS: None   TOURNIQUET TIME: 33 minutes at 250 mmHg.   INDICATIONS FOR PROCEDURE: The patient is a 50 y.o. patient of mine who presented for evaluation of left knee pain.  They presented with primary complaints of pain on the medial side of their knee. Radiographs revealed advanced medial compartment arthritis with specifically an antero-medial wear pattern.  There was bone on bone changes noted with subchondral sclerosis and osteophytes present. The patient has had progressive problems failing to respond to conservative measures of medications, injections and activity modification. Risks of infection, DVT, component failure, need for future revision surgery were all discussed and reviewed.  Consent was obtained for benefit of pain relief.   PROCEDURE IN DETAIL: The patient was brought to the operative theater.  Once adequate anesthesia, preoperative antibiotics, 2 gm Ancef, 1 gm of Tranexamic Acid, and 10 mg of Decadron administered, the patient was positioned in supine position with a left thigh tourniquet  placed. The left lower extremity was prepped and draped  in sterile  fashion with the leg on the Oxford leg holder.  The leg was allowed to flex to 120 degrees. A time-out  was performed identifying the patient, planned procedure, and extremity.  The leg was exsanguinated, tourniquet elevated to 250 mmHg. A midline  incision was made from the proximal pole of the patella to the tibial tubercle. A  soft tissue plane was created and partial median arthrotomy was then  made to allow for subluxation of the patella. Following initial synovectomy and  debridement, the osteophytes were removed off the medial aspect of the  knee.   Attention was first directed to the tibia. The tibial  extramedullary guide was positioned over the anterior crest of the tibia  and pinned into position, and using a measured resection guide from the  [place] system, a 4 mm resection was made off the proximal tibia. First  the reciprocating saw along the medial aspect of the tibial spines, then the oscillating saw.    At this point, I sized this cut surface seem to be best fit for a size C tibial tray.  With the retractors out of the wound and the knee held at 90 degrees the size 4 feeler gauge had appropriate tension on the medial ligament.   At this point, the femoral canal was opened with a drill and the  intramedullary rod passed. Then using the guide for a medium femoral resection off  the posterior aspect of the femur was positioned over the mid portion of the medial femoral condyle.  The orientation was set using the guide that mates the femoral guide to the intramedullary rod.  The 2 drill  holes were made into the distal femur.  The posterior guide was then impacted into place and the posterior  femoral cut made.  At this point, I milled the distal femur with a size 4 spigot in place. At this point, we did a trial reduction of the medium femur, size C tibial tray.  The 4 feeler gauge felt appropriate and 90 degrees but at 20 degrees the size 2 feeler gauge was  appropriate.  Given the difference in the tension between the knee in 90 degrees versus that in 20 degrees I had to place the 6 spigot into the femur and re-mill the distal femur.  Remaining bone was removed and debrided.  I repeated the trial reduction and found that now at both 90 degrees and 20 degrees the knee ligament were tension symmetrically.  Given these findings, the trial femoral component was removed. Final preparation of tibia was carried out by pinning it in position. Then  using a reciprocating saw I removed bone for the keel. Further bone was  removed with an osteotome.  Trial reduction was now carried out with the medium femur, the left keeled C tibia, and a 4 lollipop insert. The balance of the  ligaments appeared to be symmetric at 20 degrees and 90 degrees. Given  all these findings, the trial components were removed.   Cement was mixed. The final components were opened. The knee was irrigated with  normal saline solution. Then final debridements of the  soft tissue was carried out, I also drilled the sclerotic bone with a drill.  The final components were cemented with a single batch of cement in a  two-stage technique with the tibial component cemented first. The knee  was then brought  to 45 degrees of flexion with a 4 feeler gauge, held with pressure for a minute and half.  After this the femoral component was cemented in place.  The knee was again held at 45 degrees of flexion while the cement fully cured.  Excess cement was removed throughout the knee. Tourniquet was let down  after 33 minutes. After the cement had fully cured and excessive cement  was removed throughout the knee there was no visualized cement present.   The final size 4 left medial insert to match the medium femur was chosen and snapped into position. We re-irrigated  the knee. The extensor mechanism  was then reapproximated using a #1 Vicryl and #0 V-lock sutures with the knee in flexion. The   remaining wound was closed with 2-0 Vicryl and a running 4-0 Monocryl.  The knee was cleaned, dried, and dressed sterilely using Dermabond and  Aquacel dressing. The patient  was brought to the recovery room, Ace wrap in place, tolerating the  procedure well.     Pietro Cassis Alvan Dame, M.D.

## 2016-01-21 NOTE — Progress Notes (Signed)
Spoke to [place] in office she will have Adrian Prince PA e-script pain medication to patient's pharmacy in [place]. This information given to patient and family.

## 2016-01-21 NOTE — Anesthesia Procedure Notes (Signed)
Procedure Name: Intubation Performed by: Atilla Zollner J Pre-anesthesia Checklist: Patient identified, Emergency Drugs available, Suction available, Patient being monitored and Timeout performed Patient Re-evaluated:Patient Re-evaluated prior to inductionOxygen Delivery Method: Circle system utilized Preoxygenation: Pre-oxygenation with 100% oxygen Intubation Type: IV induction Ventilation: Mask ventilation without difficulty Laryngoscope Size: Mac and 4 Grade View: Grade I Tube type: Oral Tube size: 7.5 mm Number of attempts: 1 Airway Equipment and Method: Stylet Placement Confirmation: ETT inserted through vocal cords under direct vision,  positive ETCO2,  CO2 detector and breath sounds checked- equal and bilateral Secured at: 23 cm Tube secured with: Tape Dental Injury: Teeth and Oropharynx as per pre-operative assessment        

## 2016-01-21 NOTE — Transfer of Care (Signed)
Immediate Anesthesia Transfer of Care Note  Patient: Brett Olson  Procedure(s) Performed: Procedure(s): LEFT UNICOMPARTMENTAL KNEE (Left)  Patient Location: PACU  Anesthesia Type:General  Level of Consciousness: sedated, patient cooperative and responds to stimulation  Airway & Oxygen Therapy: Patient Spontanous Breathing and Patient connected to face mask oxygen  Post-op Assessment: Report given to RN and Post -op Vital signs reviewed and stable  Post vital signs: Reviewed and stable  Last Vitals:  Vitals:   01/21/16 0541  BP: 121/79  Pulse: 79  Resp: 16  Temp: 36.5 C    Last Pain:  Vitals:   01/21/16 0541  TempSrc: Oral      Patients Stated Pain Goal: 4 ([id number])  Complications: No apparent anesthesia complications

## 2016-01-21 NOTE — Discharge Instructions (Addendum)
General Anesthesia, Adult, Care After Refer to this sheet in the next few weeks. These instructions provide you with information on caring for yourself after your procedure. Your health care provider may also give you more specific instructions. Your treatment has been planned according to current medical practices, but problems sometimes occur. Call your health care provider if you have any problems or questions after your procedure. WHAT TO EXPECT AFTER THE PROCEDURE After the procedure, it is typical to experience:  Sleepiness.  Nausea and vomiting. HOME CARE INSTRUCTIONS  For the first 24 hours after general anesthesia:  Have a responsible person with you.  Do not drive a car. If you are alone, do not take public transportation.  Do not drink alcohol.  Do not take medicine that has not been prescribed by your health care provider.  Do not sign important papers or make important decisions.  You may resume a normal diet and activities as directed by your health care provider.  Change bandages (dressings) as directed.  If you have questions or problems that seem related to general anesthesia, call the hospital and ask for the anesthetist or anesthesiologist on call. SEEK MEDICAL CARE IF:  You have nausea and vomiting that continue the day after anesthesia.  You develop a rash. SEEK IMMEDIATE MEDICAL CARE IF:   You have difficulty breathing.  You have chest pain.  You have any allergic problems.   This information is not intended to replace advice given to you by your health care provider. Make sure you discuss any questions you have with your health care provider.   Document Released: 06/23/2000 Document Revised: 04/07/2014 Document Reviewed: 07/16/2011 Elsevier Interactive Patient Education 2016 [place]   o Remove items at home which could result in a fall. This includes throw rugs or furniture in walking pathways o ICE  to the affected joint every three hours while awake for 30 minutes at a time, for at least the first 3-5 days, and then as needed for pain and swelling.  Continue to use ice for pain and swelling. You may notice swelling that will progress down to the foot and ankle.  This is normal after surgery.  Elevate your leg when you are not up walking on it.   o Continue to use the breathing machine you got in the hospital (incentive spirometer) which will help keep your temperature down.  It is common for your temperature to cycle up and down following surgery, especially at night when you are not up moving around and exerting yourself.  The breathing machine keeps your lungs expanded and your temperature down.   DIET:  As you were doing prior to hospitalization, we recommend a well-balanced diet.  DRESSING / WOUND CARE / SHOWERING  Keep the surgical dressing until follow up.  The dressing is water proof, so you can shower without any extra covering.  IF THE DRESSING FALLS OFF or the wound gets wet inside, change the dressing with sterile gauze.  Please use good hand washing techniques before changing the dressing.  Do not use any lotions or creams on the incision until instructed by your surgeon.    ACTIVITY  o Increase activity slowly as tolerated, but follow the weight bearing instructions below.   o No driving for 6 weeks or until further direction given by your physician.  You cannot drive while taking narcotics.  o No lifting or carrying greater than 10 lbs. until further directed by your surgeon.  o Avoid periods of inactivity such as sitting longer than an hour when not asleep. This helps prevent blood clots.  o You may return to work once you are authorized by your doctor.     WEIGHT BEARING   Weight bearing as tolerated with assist device (walker, cane, etc) as directed, use it as long as suggested by your surgeon or therapist, typically at least 4-6 weeks.   EXERCISES  Results after  joint replacement surgery are often greatly improved when you follow the exercise, range of motion and muscle strengthening exercises prescribed by your doctor. Safety measures are also important to protect the joint from further injury. Any time any of these exercises cause you to have increased pain or swelling, decrease what you are doing until you are comfortable again and then slowly increase them. If you have problems or questions, call your caregiver or physical therapist for advice.   Rehabilitation is important following a joint replacement. After just a few days of immobilization, the muscles of the leg can become weakened and shrink (atrophy).  These exercises are designed to build up the tone and strength of the thigh and leg muscles and to improve motion. Often times heat used for twenty to thirty minutes before working out will loosen up your tissues and help with improving the range of motion but do not use heat for the first two weeks following surgery (sometimes heat can increase post-operative swelling).   These exercises can be done on a training (exercise) mat, on the floor, on a table or on a bed. Use whatever works the best and is most comfortable for you.    Use music or television while you are exercising so that the exercises are a pleasant break in your day. This will make your life better with the exercises acting as a break in your routine that you can look forward to.   Perform all exercises about fifteen times, three times per day or as directed.  You should exercise both the operative leg and the other leg as well.  Exercises include:    Quad Sets - Tighten up the muscle on the front of the thigh (Quad) and hold for 5-10 seconds.    Straight Leg Raises - With your knee straight (if you were given a brace, keep it on), lift the leg to 60 degrees, hold for 3 seconds, and slowly lower the leg.  Perform this exercise against resistance later as your leg gets stronger.   Leg  Slides: Lying on your back, slowly slide your foot toward your buttocks, bending your knee up off the floor (only go as far as is comfortable). Then slowly slide your foot back down until your leg is flat on the floor again.   Angel Wings: Lying on your back spread your legs to the side as far apart as you can without causing discomfort.   Hamstring Strength:  Lying on your back, push your heel against the floor with your leg straight by tightening up the muscles of your buttocks.  Repeat, but this time bend your knee to a comfortable angle, and push your heel against the floor.  You may put a pillow under the heel to make it more comfortable if necessary.   A rehabilitation program following joint replacement surgery can speed recovery and prevent re-injury in the future due to weakened muscles. Contact your doctor or a physical therapist for more information on knee rehabilitation.    CONSTIPATION  Constipation is defined medically  as fewer than three stools per week and severe constipation as less than one stool per week.  Even if you have a regular bowel pattern at home, your normal regimen is likely to be disrupted due to multiple reasons following surgery.  Combination of anesthesia, postoperative narcotics, change in appetite and fluid intake all can affect your bowels.   YOU MUST use at least one of the following options; they are listed in order of increasing strength to get the job done.  They are all available over the counter, and you may need to use some, POSSIBLY even all of these options:    Drink plenty of fluids (prune juice may be helpful) and high fiber foods Colace 100 mg by mouth twice a day  Senokot for constipation as directed and as needed Dulcolax (bisacodyl), take with full glass of water  Miralax (polyethylene glycol) once or twice a day as needed.  If you have tried all these things and are unable to have a bowel movement in the first 3-4 days after surgery call either  your surgeon or your primary doctor.    If you experience loose stools or diarrhea, hold the medications until you stool forms back up.  If your symptoms do not get better within 1 week or if they get worse, check with your doctor.  If you experience "the worst abdominal pain ever" or develop nausea or vomiting, please contact the office immediately for further recommendations for treatment.   ITCHING:  If you experience itching with your medications, try taking only a single pain pill, or even half a pain pill at a time.  You can also use Benadryl over the counter for itching or also to help with sleep.   TED HOSE STOCKINGS:  Use stockings on both legs until for at least 2 weeks or as directed by physician office. They may be removed at night for sleeping.  MEDICATIONS:  See your medication summary on the After Visit Summary that nursing will review with you.  You may have some home medications which will be placed on hold until you complete the course of blood thinner medication.  It is important for you to complete the blood thinner medication as prescribed.  PRECAUTIONS:  If you experience chest pain or shortness of breath - call 911 immediately for transfer to the hospital emergency department.   If you develop a fever greater that 101 F, purulent drainage from wound, increased redness or drainage from wound, foul odor from the wound/dressing, or calf pain - CONTACT YOUR SURGEON.                                                   FOLLOW-UP APPOINTMENTS:  If you do not already have a post-op appointment, please call the office for an appointment to be seen by your surgeon.  Guidelines for how soon to be seen are listed in your After Visit Summary, but are typically between 1-4 weeks after surgery.  OTHER INSTRUCTIONS:   Knee Replacement:  Do not place pillow under knee, focus on keeping the knee straight while resting.   MAKE SURE YOU:   Understand these instructions.   Get help right  away if you are not doing well or get worse.    Thank you for letting us be a part of your medical care team.  It is a privilege we respect greatly.  We hope these instructions will help you stay on track for a fast and full recovery!

## 2017-05-13 ENCOUNTER — Encounter: Payer: Self-pay | Admitting: Internal Medicine

## 2017-07-01 ENCOUNTER — Telehealth: Payer: Self-pay

## 2017-07-01 ENCOUNTER — Ambulatory Visit (INDEPENDENT_AMBULATORY_CARE_PROVIDER_SITE_OTHER): Payer: Medicare Other | Admitting: Nurse Practitioner

## 2017-07-01 ENCOUNTER — Encounter: Payer: Self-pay | Admitting: Internal Medicine

## 2017-07-01 ENCOUNTER — Other Ambulatory Visit: Payer: Self-pay

## 2017-07-01 ENCOUNTER — Encounter: Payer: Self-pay | Admitting: Nurse Practitioner

## 2017-07-01 DIAGNOSIS — Z1211 Encounter for screening for malignant neoplasm of colon: Secondary | ICD-10-CM

## 2017-07-01 DIAGNOSIS — R197 Diarrhea, unspecified: Secondary | ICD-10-CM

## 2017-07-01 DIAGNOSIS — K219 Gastro-esophageal reflux disease without esophagitis: Secondary | ICD-10-CM | POA: Diagnosis not present

## 2017-07-01 DIAGNOSIS — R69 Illness, unspecified: Secondary | ICD-10-CM

## 2017-07-01 MED ORDER — PEG 3350-KCL-NA BICARB-NACL 420 G PO SOLR: 4000.0000 mL | ORAL | 0 refills | Status: DC

## 2017-07-01 NOTE — Progress Notes (Signed)
cc'd to pcp 

## 2017-07-01 NOTE — Telephone Encounter (Signed)
EG advised for pt to have celiac panel with pre-op labs. EG advised to order "AMB celiac panel". Order entered with [facility] orders. Comment also entered on procedure order. Called and informed endo scheduler.
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
## 2017-07-01 NOTE — Assessment & Plan Note (Addendum)
The patient initially indicated he was not having additional GI symptoms.  The patient's wife pressed him to discuss his diarrhea.  He states this is chronic for a number of years.  His diarrhea is intermittent and unpredictable.  No known triggers.  He can sometimes have diarrhea every day for several days or he could go 3-4 weeks without any.  It is usually postprandial.  At this point likely differentials include IBS, gluten sensitivity, lactose intolerance, and others.  I will check a celiac panel with his preop labs prior to his colonoscopy.  Follow-up in 3 months to discuss results of colonoscopy and labs and make additional decisions for workup, if desired.

## 2017-07-01 NOTE — Assessment & Plan Note (Signed)
The patient is on Brilinta status post coronary artery stenting x2.  His last cardiac cath and stenting was 3 years ago.  He just saw cardiology 1 month ago in [place], [place].  He states they are satisfied with his progress and he is currently doing well from a cardiac standpoint, per the patient.  At this point there are no noted contraindications to proceeding with colonoscopy.  We will proceed at this time.  Follow-up in 3 months.  Proceed with TCS on propofol/MAC with Dr. Gala Romney in near future: the risks, benefits, and alternatives have been discussed with the patient in detail. The patient states understanding and desires to proceed.  The patient is currently on Brilinta and hydrocodone.  No other anticoagulants, anxiolytics, chronic pain medications, or antidepressants.  Given chronic pain medicine we will plan for the procedure on propofol/MAC to promote adequate sedation..  This will allow an extra layer of cardiac monitoring as well.  I discussed with the patient that we generally do not hold Plavix or Brilinta prior to colonoscopies.  I did mention the chance, although rare, that if he had a very large polyp that we would need to bring him back for another colonoscopy off Brilinta for polypectomy.  He verbalized understanding.

## 2017-07-01 NOTE — Assessment & Plan Note (Signed)
GERD currently well managed on PPI.  He only has symptoms if he misses a dose of his medication.  Recommend he continue to take PPI.  Follow-up in 3 months.

## 2017-07-01 NOTE — Patient Instructions (Signed)
1. We will schedule your procedure for you. 2. Have your labs drawn with your preop labs to prevent repeated blood draws. 3. As we discussed, there is no need to stop Brilinta.  There may be a rare chance that you have a large polyp in which case we might want to bring you back to remove that large polyp off Brilinta.  Dr. Gala Romney will let you know this after your colonoscopy. 4. Call if you have any questions or concerns. 5. Otherwise follow-up in 3 months.   It was great meeting you both today!  Enjoy the sunshine outside today!!!    At [facility] we value your feedback. You may receive a survey about your visit today. Please share your experience as we strive to create trusing relationships with our patients to provide genuine, compassionate, quality care.

## 2017-07-02 ENCOUNTER — Telehealth: Payer: Self-pay

## 2017-07-02 NOTE — Telephone Encounter (Signed)
Called and informed pt's wife of pre-op appt 08/07/17 at 11:00am. Letter mailed.

## 2017-08-04 NOTE — Patient Instructions (Signed)
Brett Olson  08/04/2017     @PREFPERIOPPHARMACY @   Your procedure is scheduled on  08/13/2017 .  Report to [facility] at  645   A.M.  Call this number if you have problems the morning of surgery:  [phone]   Remember:  Do not eat food or drink liquids after midnight.  Take these medicines the morning of surgery with A SIP OF WATER  Coreg, allegra or claritin or singulair, hydrocodone, levothyroxine, lisinopril, prilosec.   Do not wear jewelry, make-up or nail polish.  Do not wear lotions, powders, or perfumes, or deodorant.  Do not shave 48 hours prior to surgery.  Men may shave face and neck.  Do not bring valuables to the hospital.  [facility] is not responsible for any belongings or valuables.  Contacts, dentures or bridgework may not be worn into surgery.  Leave your suitcase in the car.  After surgery it may be brought to your room.  For patients admitted to the hospital, discharge time will be determined by your treatment team.  Patients discharged the day of surgery will not be allowed to drive home.   Name and phone number of your driver:   family Special instructions:  Follow the diet and prep instructions given to you by Dr Roseanne Kaufman office.  Please read over the following fact sheets that you were given. Anesthesia Post-op Instructions and Care and Recovery After Surgery       Colonoscopy, Adult A colonoscopy is an exam to look at the large intestine. It is done to check for problems, such as:  Lumps (tumors).  Growths (polyps).  Swelling (inflammation).  Bleeding.  What happens before the procedure? Eating and drinking Follow instructions from your doctor about eating and drinking. These instructions may include:  A few days before the procedure - follow a low-fiber diet. ? Avoid nuts. ? Avoid seeds. ? Avoid dried fruit. ? Avoid raw fruits. ? Avoid vegetables.  1-3 days before the procedure - follow a clear liquid diet.  Avoid liquids that have red or purple dye. Drink only clear liquids, such as: ? Clear broth or bouillon. ? Black coffee or tea. ? Clear juice. ? Clear soft drinks or sports drinks. ? Gelatin dessert. ? Popsicles.  On the day of the procedure - do not eat or drink anything during the 2 hours before the procedure.  Bowel prep If you were prescribed an oral bowel prep:  Take it as told by your doctor. Starting the day before your procedure, you will need to drink a lot of liquid. The liquid will cause you to poop (have bowel movements) until your poop is almost clear or light green.  If your skin or butt gets irritated from diarrhea, you may: ? Wipe the area with wipes that have medicine in them, such as adult wet wipes with aloe and vitamin E. ? Put something on your skin that soothes the area, such as petroleum jelly.  If you throw up (vomit) while drinking the bowel prep, take a break for up to 60 minutes. Then begin the bowel prep again. If you keep throwing up and you cannot take the bowel prep without throwing up, call your doctor.  General instructions  Ask your doctor about changing or stopping your normal medicines. This is important if you take diabetes medicines or blood thinners.  Plan to have someone take you home from the hospital or clinic. What happens  during the procedure?  An IV tube may be put into one of your veins.  You will be given medicine to help you relax (sedative).  To reduce your risk of infection: ? Your doctors will wash their hands. ? Your anal area will be washed with soap.  You will be asked to lie on your side with your knees bent.  Your doctor will get a long, thin, flexible tube ready. The tube will have a camera and a light on the end.  The tube will be put into your anus.  The tube will be gently put into your large intestine.  Air will be delivered into your large intestine to keep it open. You may feel some pressure or cramping.  The  camera will be used to take photos.  A small tissue sample may be removed from your body to be looked at under a microscope (biopsy). If any possible problems are found, the tissue will be sent to a lab for testing.  If small growths are found, your doctor may remove them and have them checked for cancer.  The tube that was put into your anus will be slowly removed. The procedure may vary among doctors and hospitals. What happens after the procedure?  Your doctor will check on you often until the medicines you were given have worn off.  Do not drive for 24 hours after the procedure.  You may have a small amount of blood in your poop.  You may pass gas.  You may have mild cramps or bloating in your belly (abdomen).  It is up to you to get the results of your procedure. Ask your doctor, or the department performing the procedure, when your results will be ready. This information is not intended to replace advice given to you by your health care provider. Make sure you discuss any questions you have with your health care provider. Document Released: 04/19/2010 Document Revised: 01/16/2016 Document Reviewed: 05/29/2015 Elsevier Interactive Patient Education  2017 [employer]  Colonoscopy, Adult, Care After This sheet gives you information about how to care for yourself after your procedure. Your health care provider may also give you more specific instructions. If you have problems or questions, contact your health care provider. What can I expect after the procedure? After the procedure, it is common to have:  A small amount of blood in your stool for 24 hours after the procedure.  Some gas.  Mild abdominal cramping or bloating.  Follow these instructions at home: General instructions   For the first 24 hours after the procedure: ? Do not drive or use machinery. ? Do not sign important documents. ? Do not drink alcohol. ? Do your regular daily activities at a slower pace  than normal. ? Eat soft, easy-to-digest foods. ? Rest often.  Take over-the-counter or prescription medicines only as told by your health care provider.  It is up to you to get the results of your procedure. Ask your health care provider, or the department performing the procedure, when your results will be ready. Relieving cramping and bloating  Try walking around when you have cramps or feel bloated.  Apply heat to your abdomen as told by your health care provider. Use a heat source that your health care provider recommends, such as a moist heat pack or a heating pad. ? Place a towel between your skin and the heat source. ? Leave the heat on for 20-30 minutes. ? Remove the heat if your skin turns  bright red. This is especially important if you are unable to feel pain, heat, or cold. You may have a greater risk of getting burned. Eating and drinking  Drink enough fluid to keep your urine clear or pale yellow.  Resume your normal diet as instructed by your health care provider. Avoid heavy or fried foods that are hard to digest.  Avoid drinking alcohol for as long as instructed by your health care provider. Contact a health care provider if:  You have blood in your stool 2-3 days after the procedure. Get help right away if:  You have more than a small spotting of blood in your stool.  You pass large blood clots in your stool.  Your abdomen is swollen.  You have nausea or vomiting.  You have a fever.  You have increasing abdominal pain that is not relieved with medicine. This information is not intended to replace advice given to you by your health care provider. Make sure you discuss any questions you have with your health care provider. Document Released: 10/30/2003 Document Revised: 12/10/2015 Document Reviewed: 05/29/2015 Elsevier Interactive Patient Education  2018 [place] Anesthesia is a term that refers to techniques, procedures, and  medicines that help a person stay safe and comfortable during a medical procedure. Monitored anesthesia care, or sedation, is one type of anesthesia. Your anesthesia specialist may recommend sedation if you will be having a procedure that does not require you to be unconscious, such as:  Cataract surgery.  A dental procedure.  A biopsy.  A colonoscopy.  During the procedure, you may receive a medicine to help you relax (sedative). There are three levels of sedation:  Mild sedation. At this level, you may feel awake and relaxed. You will be able to follow directions.  Moderate sedation. At this level, you will be sleepy. You may not remember the procedure.  Deep sedation. At this level, you will be asleep. You will not remember the procedure.  The more medicine you are given, the deeper your level of sedation will be. Depending on how you respond to the procedure, the anesthesia specialist may change your level of sedation or the type of anesthesia to fit your needs. An anesthesia specialist will monitor you closely during the procedure. Let your health care provider know about:  Any allergies you have.  All medicines you are taking, including vitamins, herbs, eye drops, creams, and over-the-counter medicines.  Any use of steroids (by mouth or as a cream).  Any problems you or family members have had with sedatives and anesthetic medicines.  Any blood disorders you have.  Any surgeries you have had.  Any medical conditions you have, such as sleep apnea.  Whether you are pregnant or may be pregnant.  Any use of cigarettes, alcohol, or street drugs. What are the risks? Generally, this is a safe procedure. However, problems may occur, including:  Getting too much medicine (oversedation).  Nausea.  Allergic reaction to medicines.  Trouble breathing. If this happens, a breathing tube may be used to help with breathing. It will be removed when you are awake and breathing on  your own.  Heart trouble.  Lung trouble.  Before the procedure Staying hydrated Follow instructions from your health care provider about hydration, which may include:  Up to 2 hours before the procedure - you may continue to drink clear liquids, such as water, clear fruit juice, black coffee, and plain tea.  Eating and drinking restrictions Follow instructions from  your health care provider about eating and drinking, which may include:  8 hours before the procedure - stop eating heavy meals or foods such as meat, fried foods, or fatty foods.  6 hours before the procedure - stop eating light meals or foods, such as toast or cereal.  6 hours before the procedure - stop drinking milk or drinks that contain milk.  2 hours before the procedure - stop drinking clear liquids.  Medicines Ask your health care provider about:  Changing or stopping your regular medicines. This is especially important if you are taking diabetes medicines or blood thinners.  Taking medicines such as aspirin and ibuprofen. These medicines can thin your blood. Do not take these medicines before your procedure if your health care provider instructs you not to.  Tests and exams  You will have a physical exam.  You may have blood tests done to show: ? How well your kidneys and liver are working. ? How well your blood can clot.  General instructions  Plan to have someone take you home from the hospital or clinic.  If you will be going home right after the procedure, plan to have someone with you for 24 hours.  What happens during the procedure?  Your blood pressure, heart rate, breathing, level of pain and overall condition will be monitored.  An IV tube will be inserted into one of your veins.  Your anesthesia specialist will give you medicines as needed to keep you comfortable during the procedure. This may mean changing the level of sedation.  The procedure will be performed. After the  procedure  Your blood pressure, heart rate, breathing rate, and blood oxygen level will be monitored until the medicines you were given have worn off.  Do not drive for 24 hours if you received a sedative.  You may: ? Feel sleepy, clumsy, or nauseous. ? Feel forgetful about what happened after the procedure. ? Have a sore throat if you had a breathing tube during the procedure. ? Vomit. This information is not intended to replace advice given to you by your health care provider. Make sure you discuss any questions you have with your health care provider. Document Released: 12/11/2004 Document Revised: 08/24/2015 Document Reviewed: 07/08/2015 Elsevier Interactive Patient Education  2018 [place], Care After These instructions provide you with information about caring for yourself after your procedure. Your health care provider may also give you more specific instructions. Your treatment has been planned according to current medical practices, but problems sometimes occur. Call your health care provider if you have any problems or questions after your procedure. What can I expect after the procedure? After your procedure, it is common to:  Feel sleepy for several hours.  Feel clumsy and have poor balance for several hours.  Feel forgetful about what happened after the procedure.  Have poor judgment for several hours.  Feel nauseous or vomit.  Have a sore throat if you had a breathing tube during the procedure.  Follow these instructions at home: For at least 24 hours after the procedure:   Do not: ? Participate in activities in which you could fall or become injured. ? Drive. ? Use heavy machinery. ? Drink alcohol. ? Take sleeping pills or medicines that cause drowsiness. ? Make important decisions or sign legal documents. ? Take care of children on your own.  Rest. Eating and drinking  Follow the diet that is recommended by your health  care provider.  If you  vomit, drink water, juice, or soup when you can drink without vomiting.  Make sure you have little or no nausea before eating solid foods. General instructions  Have a responsible adult stay with you until you are awake and alert.  Take over-the-counter and prescription medicines only as told by your health care provider.  If you smoke, do not smoke without supervision.  Keep all follow-up visits as told by your health care provider. This is important. Contact a health care provider if:  You keep feeling nauseous or you keep vomiting.  You feel light-headed.  You develop a rash.  You have a fever. Get help right away if:  You have trouble breathing. This information is not intended to replace advice given to you by your health care provider. Make sure you discuss any questions you have with your health care provider. Document Released: 07/08/2015 Document Revised: 11/07/2015 Document Reviewed: 07/08/2015 Elsevier Interactive Patient Education  [employer].

## 2017-08-07 ENCOUNTER — Encounter (HOSPITAL_COMMUNITY)
Admission: RE | Admit: 2017-08-07 | Discharge: 2017-08-07 | Disposition: A | Discharge status: Home / Self Care | Payer: Medicare Other | Source: Ambulatory Visit | Attending: Internal Medicine | Admitting: Internal Medicine

## 2017-08-07 ENCOUNTER — Encounter (HOSPITAL_COMMUNITY): Payer: Self-pay

## 2017-08-07 ENCOUNTER — Other Ambulatory Visit: Payer: Self-pay

## 2017-08-07 DIAGNOSIS — Z01812 Encounter for preprocedural laboratory examination: Secondary | ICD-10-CM | POA: Insufficient documentation

## 2017-08-07 DIAGNOSIS — Z01810 Encounter for preprocedural cardiovascular examination: Secondary | ICD-10-CM | POA: Diagnosis not present

## 2017-08-07 DIAGNOSIS — R197 Diarrhea, unspecified: Secondary | ICD-10-CM

## 2017-08-07 LAB — CBC WITH DIFFERENTIAL/PLATELET
BASOS ABS: 0 10*3/uL (ref 0.0–0.1)
Basophils Relative: 0 %
EOS PCT: 3 %
Eosinophils Absolute: 0.2 10*3/uL (ref 0.0–0.7)
HCT: 41.7 % (ref 39.0–52.0)
HEMOGLOBIN: 13.9 g/dL (ref 13.0–17.0)
LYMPHS PCT: 27 %
Lymphs Abs: 1.5 10*3/uL (ref 0.7–4.0)
MCH: 30.9 pg (ref 26.0–34.0)
MCHC: 33.3 g/dL (ref 30.0–36.0)
MCV: 92.7 fL (ref 78.0–100.0)
MONOS PCT: 10 %
Monocytes Absolute: 0.6 10*3/uL (ref 0.1–1.0)
NEUTROS ABS: 3.4 10*3/uL (ref 1.7–7.7)
Neutrophils Relative %: 60 %
Platelets: 239 10*3/uL (ref 150–400)
RBC: 4.5 MIL/uL (ref 4.22–5.81)
RDW: 13 % (ref 11.5–15.5)
WBC: 5.6 10*3/uL (ref 4.0–10.5)

## 2017-08-07 LAB — BASIC METABOLIC PANEL
ANION GAP: 10 (ref 5–15)
BUN: 13 mg/dL (ref 6–20)
CHLORIDE: 101 mmol/L (ref 101–111)
CO2: 29 mmol/L (ref 22–32)
Calcium: 9.2 mg/dL (ref 8.9–10.3)
Creatinine, Ser: 0.72 mg/dL (ref 0.61–1.24)
GFR calc Af Amer: >60 mL/min (ref >60)
GFR calc non Af Amer: >60 mL/min (ref >60)
GLUCOSE: 115 mg/dL — AB (ref 65–99)
POTASSIUM: 4.2 mmol/L (ref 3.5–5.1)
Sodium: 140 mmol/L (ref 135–145)

## 2017-08-07 NOTE — Pre-Procedure Instructions (Signed)
While questioning patient for PAT he is very abrupt with answers and questions "why do you need to know all this stuff. This is stupid. I am not happy about this, I am going to have to go 2 days without eating before this and this makes no sense to me". I explained rationale and reasoning behind doing things this way. He shakes his head and rolls his eyes at staff. When questioning about who would be driving him home he states, " I am". I explained that unless he has someone to drive him home, he cannot have this procedure because of the effects of the medications. He states, "Lattie Haw will be with me, but watch me drive. She's not driving, I am." Explained again rationale behind him not driving, he again shakes his head. While going over preop instructions and meds to take, he becomes very argumentative and states, " the other doctors office who is doing this procedure told me I didn't need to take my medications and you are telling me I do and I saw the doctor over there and you aren't a doctor and I'm supposed to listen to you?'' Explained to patient that we work very closely with the anesthesiologist and these guidelines determine what medication we have patient take prior to surgery. He again is very Argumentative and states, "you should all get on the same page, this is ridiculous". Restated information to him again and he states, "are we done". I told him yes and patient walks out and tells wife in the waiting room "don't nobody know what they are doing, I am going to do what I want to do."

## 2017-08-10 LAB — TISSUE TRANSGLUTAMINASE, IGA

## 2017-08-10 LAB — GLIADIN ANTIBODIES, SERUM
Antigliadin Abs, IgA: 5 units (ref 0–19)
Gliadin IgG: 3 units (ref 0–19)

## 2017-08-11 ENCOUNTER — Telehealth: Payer: Self-pay

## 2017-08-11 ENCOUNTER — Other Ambulatory Visit: Payer: Self-pay

## 2017-08-11 DIAGNOSIS — R197 Diarrhea, unspecified: Secondary | ICD-10-CM

## 2017-08-11 NOTE — Telephone Encounter (Signed)
Lab order added on to exisiting labs with [facility] test code [id number]. Waiting on results to result in system.

## 2017-08-12 LAB — IGA: IgA: 197 mg/dL (ref 90–386)

## 2017-08-12 LAB — RETICULIN ANTIBODIES, IGA W TITER: RETICULIN AB, IGA: NEGATIVE {titer}

## 2017-08-13 ENCOUNTER — Encounter (HOSPITAL_COMMUNITY)
Admission: RE | Disposition: A | Discharge status: Home / Self Care | Payer: Self-pay | Source: Ambulatory Visit | Attending: Internal Medicine

## 2017-08-13 ENCOUNTER — Ambulatory Visit (HOSPITAL_COMMUNITY): Payer: Medicare Other | Admitting: Anesthesiology

## 2017-08-13 ENCOUNTER — Encounter (HOSPITAL_COMMUNITY): Payer: Self-pay

## 2017-08-13 ENCOUNTER — Ambulatory Visit (HOSPITAL_COMMUNITY)
Admission: RE | Admit: 2017-08-13 | Discharge: 2017-08-13 | Disposition: A | Discharge status: Home / Self Care | Payer: Medicare Other | Source: Ambulatory Visit | Attending: Internal Medicine | Admitting: Internal Medicine

## 2017-08-13 DIAGNOSIS — Z7902 Long term (current) use of antithrombotics/antiplatelets: Secondary | ICD-10-CM | POA: Diagnosis not present

## 2017-08-13 DIAGNOSIS — Z923 Personal history of irradiation: Secondary | ICD-10-CM | POA: Diagnosis not present

## 2017-08-13 DIAGNOSIS — Z1212 Encounter for screening for malignant neoplasm of rectum: Secondary | ICD-10-CM | POA: Diagnosis not present

## 2017-08-13 DIAGNOSIS — Z96652 Presence of left artificial knee joint: Secondary | ICD-10-CM | POA: Insufficient documentation

## 2017-08-13 DIAGNOSIS — Z8572 Personal history of non-Hodgkin lymphomas: Secondary | ICD-10-CM | POA: Diagnosis not present

## 2017-08-13 DIAGNOSIS — R197 Diarrhea, unspecified: Secondary | ICD-10-CM | POA: Insufficient documentation

## 2017-08-13 DIAGNOSIS — I252 Old myocardial infarction: Secondary | ICD-10-CM | POA: Insufficient documentation

## 2017-08-13 DIAGNOSIS — D124 Benign neoplasm of descending colon: Secondary | ICD-10-CM | POA: Diagnosis not present

## 2017-08-13 DIAGNOSIS — E039 Hypothyroidism, unspecified: Secondary | ICD-10-CM | POA: Insufficient documentation

## 2017-08-13 DIAGNOSIS — K5730 Diverticulosis of large intestine without perforation or abscess without bleeding: Secondary | ICD-10-CM | POA: Insufficient documentation

## 2017-08-13 DIAGNOSIS — Z79890 Hormone replacement therapy (postmenopausal): Secondary | ICD-10-CM | POA: Diagnosis not present

## 2017-08-13 DIAGNOSIS — D123 Benign neoplasm of transverse colon: Secondary | ICD-10-CM | POA: Diagnosis not present

## 2017-08-13 DIAGNOSIS — Z7982 Long term (current) use of aspirin: Secondary | ICD-10-CM | POA: Diagnosis not present

## 2017-08-13 DIAGNOSIS — Z79891 Long term (current) use of opiate analgesic: Secondary | ICD-10-CM | POA: Insufficient documentation

## 2017-08-13 DIAGNOSIS — Z79899 Other long term (current) drug therapy: Secondary | ICD-10-CM | POA: Insufficient documentation

## 2017-08-13 DIAGNOSIS — Z87891 Personal history of nicotine dependence: Secondary | ICD-10-CM | POA: Diagnosis not present

## 2017-08-13 DIAGNOSIS — I10 Essential (primary) hypertension: Secondary | ICD-10-CM | POA: Insufficient documentation

## 2017-08-13 DIAGNOSIS — K219 Gastro-esophageal reflux disease without esophagitis: Secondary | ICD-10-CM | POA: Insufficient documentation

## 2017-08-13 DIAGNOSIS — Z87820 Personal history of traumatic brain injury: Secondary | ICD-10-CM | POA: Insufficient documentation

## 2017-08-13 DIAGNOSIS — I2510 Atherosclerotic heart disease of native coronary artery without angina pectoris: Secondary | ICD-10-CM | POA: Insufficient documentation

## 2017-08-13 DIAGNOSIS — E785 Hyperlipidemia, unspecified: Secondary | ICD-10-CM | POA: Diagnosis not present

## 2017-08-13 DIAGNOSIS — D120 Benign neoplasm of cecum: Secondary | ICD-10-CM | POA: Insufficient documentation

## 2017-08-13 DIAGNOSIS — Z9221 Personal history of antineoplastic chemotherapy: Secondary | ICD-10-CM | POA: Diagnosis not present

## 2017-08-13 DIAGNOSIS — Z1211 Encounter for screening for malignant neoplasm of colon: Secondary | ICD-10-CM | POA: Diagnosis not present

## 2017-08-13 HISTORY — PX: COLONOSCOPY WITH PROPOFOL: SHX5780

## 2017-08-13 HISTORY — PX: POLYPECTOMY: SHX5525

## 2017-08-13 SURGERY — COLONOSCOPY WITH PROPOFOL
Anesthesia: Monitor Anesthesia Care

## 2017-08-13 MED ORDER — LACTATED RINGERS IV SOLN
INTRAVENOUS | Status: DC
  Administered 2017-08-13 (×2): via INTRAVENOUS

## 2017-08-13 MED ORDER — PROPOFOL 10 MG/ML IV BOLUS
INTRAVENOUS | Status: AC
  Filled 2017-08-13: qty 40

## 2017-08-13 MED ORDER — CHLORHEXIDINE GLUCONATE CLOTH 2 % EX PADS: 6.0000 | MEDICATED_PAD | Freq: Once | CUTANEOUS | Status: DC

## 2017-08-13 MED ORDER — PROPOFOL 10 MG/ML IV BOLUS
INTRAVENOUS | Status: AC
  Filled 2017-08-13: qty 60

## 2017-08-13 MED ORDER — GLYCOPYRROLATE 0.2 MG/ML IJ SOLN
INTRAMUSCULAR | Status: DC | PRN
  Administered 2017-08-13: 0.2 mg via INTRAVENOUS

## 2017-08-13 MED ORDER — LIDOCAINE HCL (CARDIAC) PF 50 MG/5ML IV SOSY
PREFILLED_SYRINGE | INTRAVENOUS | Status: DC | PRN
  Administered 2017-08-13: 50 mg via INTRAVENOUS

## 2017-08-13 MED ORDER — PROPOFOL 500 MG/50ML IV EMUL
INTRAVENOUS | Status: DC | PRN
  Administered 2017-08-13 (×2): via INTRAVENOUS
  Administered 2017-08-13: 200 ug/kg/min via INTRAVENOUS
  Administered 2017-08-13: 09:00:00 via INTRAVENOUS

## 2017-08-13 MED ORDER — GLYCOPYRROLATE 0.2 MG/ML IJ SOLN
INTRAMUSCULAR | Status: AC
  Filled 2017-08-13: qty 1

## 2017-08-13 MED ORDER — LIDOCAINE HCL (PF) 1 % IJ SOLN
INTRAMUSCULAR | Status: AC
  Filled 2017-08-13: qty 5

## 2017-08-13 NOTE — Discharge Instructions (Signed)
Colonoscopy Discharge Instructions  Read the instructions outlined below and refer to this sheet in the next few weeks. These discharge instructions provide you with general information on caring for yourself after you leave the hospital. Your doctor may also give you specific instructions. While your treatment has been planned according to the most current medical practices available, unavoidable complications occasionally occur. If you have any problems or questions after discharge, call Dr. Gala Romney at [phone]. ACTIVITY  You may resume your regular activity, but move at a slower pace for the next 24 hours.   Take frequent rest periods for the next 24 hours.   Walking will help get rid of the air and reduce the bloated feeling in your belly (abdomen).   No driving for 24 hours (because of the medicine (anesthesia) used during the test).    Do not sign any important legal documents or operate any machinery for 24 hours (because of the anesthesia used during the test).  NUTRITION  Drink plenty of fluids.   You may resume your normal diet as instructed by your doctor.   Begin with a light meal and progress to your normal diet. Heavy or fried foods are harder to digest and may make you feel sick to your stomach (nauseated).   Avoid alcoholic beverages for 24 hours or as instructed.  MEDICATIONS  You may resume your normal medications unless your doctor tells you otherwise.  WHAT YOU CAN EXPECT TODAY  Some feelings of bloating in the abdomen.   Passage of more gas than usual.   Spotting of blood in your stool or on the toilet paper.  IF YOU HAD POLYPS REMOVED DURING THE COLONOSCOPY:  No aspirin products for 7 days or as instructed.   No alcohol for 7 days or as instructed.   Eat a soft diet for the next 24 hours.  FINDING OUT THE RESULTS OF YOUR TEST Not all test results are available during your visit. If your test results are not back during the visit, make an appointment  with your caregiver to find out the results. Do not assume everything is normal if you have not heard from your caregiver or the medical facility. It is important for you to follow up on all of your test results.  SEEK IMMEDIATE MEDICAL ATTENTION IF:  You have more than a spotting of blood in your stool.   Your belly is swollen (abdominal distention).   You are nauseated or vomiting.   You have a temperature over 101.   You have abdominal pain or discomfort that is severe or gets worse throughout the day.   Colon polyp and diverticulosis information provided  Further recommendations to follow pending review of pathology report  No future MRI until clips gone    PATIENT INSTRUCTIONS POST-ANESTHESIA  IMMEDIATELY FOLLOWING SURGERY:  Do not drive or operate machinery for the first twenty four hours after surgery.  Do not make any important decisions for twenty four hours after surgery or while taking narcotic pain medications or sedatives.  If you develop intractable nausea and vomiting or a severe headache please notify your doctor immediately.  FOLLOW-UP:  Please make an appointment with your surgeon as instructed. You do not need to follow up with anesthesia unless specifically instructed to do so.  WOUND CARE INSTRUCTIONS (if applicable):  Keep a dry clean dressing on the anesthesia/puncture wound site if there is drainage.  Once the wound has quit draining you may leave it open to air.  Generally you  should leave the bandage intact for twenty four hours unless there is drainage.  If the epidural site drains for more than 36-48 hours please call the anesthesia department.  QUESTIONS?:  Please feel free to call your physician or the hospital operator if you have any questions, and they will be happy to assist you.        Colon Polyps Polyps are tissue growths inside the body. Polyps can grow in many places, including the large intestine (colon). A polyp may be a round bump or a  mushroom-shaped growth. You could have one polyp or several. Most colon polyps are noncancerous (benign). However, some colon polyps can become cancerous over time. What are the causes? The exact cause of colon polyps is not known. What increases the risk? This condition is more likely to develop in people who:  Have a family history of colon cancer or colon polyps.  Are older than 80 or older than 45 if they are African American.  Have inflammatory bowel disease, such as ulcerative colitis or Crohn disease.  Are overweight.  Smoke cigarettes.  Do not get enough exercise.  Drink too much alcohol.  Eat a diet that is: ? High in fat and red meat. ? Low in fiber.  Had childhood cancer that was treated with abdominal radiation.  What are the signs or symptoms? Most polyps do not cause symptoms. If you have symptoms, they may include:  Blood coming from your rectum when having a bowel movement.  Blood in your stool.The stool may look dark red or black.  A change in bowel habits, such as constipation or diarrhea.  How is this diagnosed? This condition is diagnosed with a colonoscopy. This is a procedure that uses a lighted, flexible scope to look at the inside of your colon. How is this treated? Treatment for this condition involves removing any polyps that are found. Those polyps will then be tested for cancer. If cancer is found, your health care provider will talk to you about options for colon cancer treatment. Follow these instructions at home: Diet  Eat plenty of fiber, such as fruits, vegetables, and whole grains.  Eat foods that are high in calcium and vitamin D, such as milk, cheese, yogurt, eggs, liver, fish, and broccoli.  Limit foods high in fat, red meats, and processed meats, such as hot dogs, sausage, bacon, and lunch meats.  Maintain a healthy weight, or lose weight if recommended by your health care provider. General instructions  Do not smoke  cigarettes.  Do not drink alcohol excessively.  Keep all follow-up visits as told by your health care provider. This is important. This includes keeping regularly scheduled colonoscopies. Talk to your health care provider about when you need a colonoscopy.  Exercise every day or as told by your health care provider. Contact a health care provider if:  You have new or worsening bleeding during a bowel movement.  You have new or increased blood in your stool.  You have a change in bowel habits.  You unexpectedly lose weight. This information is not intended to replace advice given to you by your health care provider. Make sure you discuss any questions you have with your health care provider. Document Released: 12/12/2003 Document Revised: 08/23/2015 Document Reviewed: 02/05/2015 Elsevier Interactive Patient Education  [employer].    Diverticulosis Diverticulosis is a condition that develops when small pouches (diverticula) form in the wall of the large intestine (colon). The colon is where water is absorbed and  stool is formed. The pouches form when the inside layer of the colon pushes through weak spots in the outer layers of the colon. You may have a few pouches or many of them. What are the causes? The cause of this condition is not known. What increases the risk? The following factors may make you more likely to develop this condition:  Being older than age 34. Your risk for this condition increases with age. Diverticulosis is rare among people younger than age 56. By age 58, many people have it.  Eating a low-fiber diet.  Having frequent constipation.  Being overweight.  Not getting enough exercise.  Smoking.  Taking over-the-counter pain medicines, like aspirin and ibuprofen.  Having a family history of diverticulosis.  What are the signs or symptoms? In most people, there are no symptoms of this condition. If you do have symptoms, they may  include:  Bloating.  Cramps in the abdomen.  Constipation or diarrhea.  Pain in the lower left side of the abdomen.  How is this diagnosed? This condition is most often diagnosed during an exam for other colon problems. Because diverticulosis usually has no symptoms, it often cannot be diagnosed independently. This condition may be diagnosed by:  Using a flexible scope to examine the colon (colonoscopy).  Taking an X-ray of the colon after dye has been put into the colon (barium enema).  Doing a CT scan.  How is this treated? You may not need treatment for this condition if you have never developed an infection related to diverticulosis. If you have had an infection before, treatment may include:  Eating a high-fiber diet. This may include eating more fruits, vegetables, and grains.  Taking a fiber supplement.  Taking a live bacteria supplement (probiotic).  Taking medicine to relax your colon.  Taking antibiotic medicines.  Follow these instructions at home:  Drink 6-8 glasses of water or more each day to prevent constipation.  Try not to strain when you have a bowel movement.  If you have had an infection before: ? Eat more fiber as directed by your health care provider or your diet and nutrition specialist (dietitian). ? Take a fiber supplement or probiotic, if your health care provider approves.  Take over-the-counter and prescription medicines only as told by your health care provider.  If you were prescribed an antibiotic, take it as told by your health care provider. Do not stop taking the antibiotic even if you start to feel better.  Keep all follow-up visits as told by your health care provider. This is important. Contact a health care provider if:  You have pain in your abdomen.  You have bloating.  You have cramps.  You have not had a bowel movement in 3 days. Get help right away if:  Your pain gets worse.  Your bloating becomes very bad.  You  have a fever or chills, and your symptoms suddenly get worse.  You vomit.  You have bowel movements that are bloody or black.  You have bleeding from your rectum. Summary  Diverticulosis is a condition that develops when small pouches (diverticula) form in the wall of the large intestine (colon).  You may have a few pouches or many of them.  This condition is most often diagnosed during an exam for other colon problems.  If you have had an infection related to diverticulosis, treatment may include increasing the fiber in your diet, taking supplements, or taking medicines. This information is not intended to replace advice given  to you by your health care provider. Make sure you discuss any questions you have with your health care provider. Document Released: 12/13/2003 Document Revised: 02/04/2016 Document Reviewed: 02/04/2016 Elsevier Interactive Patient Education  2017 [employer].

## 2017-08-13 NOTE — Transfer of Care (Signed)
Immediate Anesthesia Transfer of Care Note  Patient: Brett Olson  Procedure(s) Performed: COLONOSCOPY WITH PROPOFOL (N/A ) POLYPECTOMY  Patient Location: PACU  Anesthesia Type:MAC  Level of Consciousness: awake, alert , oriented and patient cooperative  Airway & Oxygen Therapy: Patient Spontanous Breathing  Post-op Assessment: Report given to RN and Post -op Vital signs reviewed and stable  Post vital signs: Reviewed and stable  Last Vitals:  Vitals Value Taken Time  BP    Temp    Pulse 90 08/13/2017  9:27 AM  Resp    SpO2 93 % 08/13/2017  9:27 AM  Vitals shown include unvalidated device data.  Last Pain:  Vitals:   08/13/17 0828  TempSrc:   PainSc: 0-No pain         Complications: No apparent anesthesia complications
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
## 2017-08-13 NOTE — Op Note (Signed)
[facility] Patient Name: Brett Olson Procedure Date: 08/13/2017 8:12 AM MRN: [id number] Date of Birth: 13-Nov-1965 Attending MD: Norvel Richards , MD CSN: [id number] Age: 52 Admit Type: Outpatient Procedure:                Colonoscopy Indications:              Screening for colorectal malignant neoplasm;                            remains on Brilinta per plan Providers:                Norvel Richards, MD, Janeece Riggers, RN, Aram Candela Referring MD:              Medicines:                Propofol per Anesthesia Complications:            No immediate complications. Estimated Blood Loss:     Estimated blood loss was minimal. Procedure:                After obtaining informed consent, the colonoscope                            was passed under direct vision. Throughout the                            procedure, the patient's blood pressure, pulse, and                            oxygen saturations were monitored continuously. The                            EC-[id number] ([id number]) scope was introduced through                            the anus and advanced to the the cecum, identified                            by appendiceal orifice and ileocecal valve. The                            colonoscopy was performed without difficulty. The                            patient tolerated the procedure well. The quality                            of the bowel preparation was adequate. The entire                            colon was well visualized. The quality of the bowel  preparation was adequate. Scope In: 8:31:06 AM Scope Out: 9:18:35 AM Scope Withdrawal Time: 0 hours 41 minutes 43 seconds  Total Procedure Duration: 0 hours 47 minutes 29 seconds  Findings:      The perianal and digital rectal examinations were normal.      Scattered medium-mouthed diverticula were found in the sigmoid colon and       descending colon.      Two  semi-pedunculated polyps were found in the descending colon and       cecum. The polyps were 7 to 20 mm in size. These polyps were removed       with a hot snare. the larger polyp was in the descending segment.       Elloview - 7 mL L used to nicely lift it away from the colonic wall. It       was removed with one pass - hot snare technique. Both were recovered.       Hemostasis clips employed to close the defect to ensure post procedure       hemostasis. 6 clips deployed with a net of 5 in good position on larger       polypectomy site. Good hemostasis maintained. Resection and retrieval       were complete. One hemostasis clip placed on cecal polyp as well.       Estimated blood loss was minimal.      Two semi-pedunculated polyps were found in the descending colon, and       distal transverse colon. The polyps were 4 to 6 mm in size. These polyps       were removed with a cold snare. Resection and retrieval were complete.       Estimated blood loss was minimal.      The exam was otherwise without abnormality on direct and retroflexion       views. Impression:               - Diverticulosis in the sigmoid colon and in the                            descending colon.                           - Two 7 to 20 mm polyps in the descending colon and                            in the cecum, removed with a hot snare. Resected                            and retrieved.                           - Two 4 to 6 mm polyps in the descending colon, in                            the transverse colon and in the distal transverse                            colon, removed with a cold snare. Resected and  retrieved. Hemostasis clips placed as outlined                            above.                           - The examination was otherwise normal on direct                            and retroflexion views. Moderate Sedation:      Moderate (conscious) sedation was personally  administered by an       anesthesia professional. The following parameters were monitored: oxygen       saturation, heart rate, blood pressure, respiratory rate, EKG, adequacy       of pulmonary ventilation, and response to care. Total physician       intraservice time was 53 minutes. Recommendation:           - Patient has a contact number available for                            emergencies. The signs and symptoms of potential                            delayed complications were discussed with the                            patient. Return to normal activities tomorrow.                            Written discharge instructions were provided to the                            patient.                           - Resume previous diet.                           - Continue present medications.                           - Await pathology results.                           - Repeat colonoscopy date to be determined after                            pending pathology results are reviewed for                            surveillance based on pathology results.                           - Return to GI office (date not yet determined NO  FUTURE MRI until clips gone Procedure Code(s):        --- Professional ---                           [phone], Colonoscopy, flexible; with removal of                            tumor(s), polyp(s), or other lesion(s) by snare                            technique Diagnosis Code(s):        --- Professional ---                           Z12.11, Encounter for screening for malignant                            neoplasm of colon                           D12.0, Benign neoplasm of cecum                           D12.4, Benign neoplasm of descending colon                           D12.3, Benign neoplasm of transverse colon (hepatic                            flexure or splenic flexure)                           K57.30, Diverticulosis of large  intestine without                            perforation or abscess without bleeding CPT copyright 2017 American [employer]. All rights reserved. The codes documented in this report are preliminary and upon coder review may  be revised to meet current compliance requirements. Cristopher Estimable. Caylyn Tedeschi, MD Norvel Richards, MD 08/13/2017 9:35:44 AM This report has been signed electronically. Number of Addenda: 0

## 2017-08-13 NOTE — Anesthesia Postprocedure Evaluation (Signed)
Anesthesia Post Note  Patient: Brett Olson  Procedure(s) Performed: COLONOSCOPY WITH PROPOFOL (N/A ) POLYPECTOMY  Patient location during evaluation: PACU Anesthesia Type: MAC Level of consciousness: awake and alert and oriented Pain management: pain level controlled Vital Signs Assessment: post-procedure vital signs reviewed and stable Respiratory status: spontaneous breathing Cardiovascular status: blood pressure returned to baseline Postop Assessment: no apparent nausea or vomiting Anesthetic complications: no     Last Vitals:  Vitals:   08/13/17 0716  BP: 134/84  Pulse: 81  Temp: 36.8 C  SpO2: 98%    Last Pain:  Vitals:   08/13/17 0828  TempSrc:   PainSc: 0-No pain                 ADAMS, AMY A
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
## 2017-08-14 ENCOUNTER — Encounter: Payer: Self-pay | Admitting: Internal Medicine

## 2017-08-17 ENCOUNTER — Encounter (HOSPITAL_COMMUNITY): Payer: Self-pay | Admitting: Internal Medicine

## 2017-09-21 ENCOUNTER — Telehealth: Payer: Self-pay

## 2017-09-21 NOTE — Telephone Encounter (Signed)
No entry 

## 2017-10-07 ENCOUNTER — Ambulatory Visit: Payer: Medicare Other | Admitting: Nurse Practitioner

## 2018-01-11 ENCOUNTER — Emergency Department (HOSPITAL_COMMUNITY)
Admission: EM | Admit: 2018-01-11 | Discharge: 2018-01-11 | Disposition: A | Discharge status: Home / Self Care | Payer: Medicare Other | Attending: Emergency Medicine | Admitting: Emergency Medicine

## 2018-01-11 ENCOUNTER — Encounter (HOSPITAL_COMMUNITY): Payer: Self-pay

## 2018-01-11 ENCOUNTER — Other Ambulatory Visit: Payer: Self-pay

## 2018-01-11 DIAGNOSIS — Z7982 Long term (current) use of aspirin: Secondary | ICD-10-CM | POA: Diagnosis not present

## 2018-01-11 DIAGNOSIS — E785 Hyperlipidemia, unspecified: Secondary | ICD-10-CM | POA: Insufficient documentation

## 2018-01-11 DIAGNOSIS — Z87891 Personal history of nicotine dependence: Secondary | ICD-10-CM | POA: Insufficient documentation

## 2018-01-11 DIAGNOSIS — I2510 Atherosclerotic heart disease of native coronary artery without angina pectoris: Secondary | ICD-10-CM | POA: Insufficient documentation

## 2018-01-11 DIAGNOSIS — E039 Hypothyroidism, unspecified: Secondary | ICD-10-CM | POA: Diagnosis not present

## 2018-01-11 DIAGNOSIS — Z79899 Other long term (current) drug therapy: Secondary | ICD-10-CM | POA: Diagnosis not present

## 2018-01-11 DIAGNOSIS — L03116 Cellulitis of left lower limb: Secondary | ICD-10-CM | POA: Insufficient documentation

## 2018-01-11 DIAGNOSIS — M79605 Pain in left leg: Secondary | ICD-10-CM | POA: Diagnosis present

## 2018-01-11 DIAGNOSIS — I252 Old myocardial infarction: Secondary | ICD-10-CM | POA: Insufficient documentation

## 2018-01-11 DIAGNOSIS — I10 Essential (primary) hypertension: Secondary | ICD-10-CM | POA: Insufficient documentation

## 2018-01-11 MED ORDER — CLINDAMYCIN HCL 300 MG PO CAPS: 300.0000 mg | ORAL_CAPSULE | Freq: Four times a day (QID) | ORAL | 0 refills | Status: DC

## 2018-01-11 MED ORDER — CLINDAMYCIN HCL 150 MG PO CAPS
300.0000 mg | ORAL_CAPSULE | Freq: Once | ORAL | Status: AC
  Administered 2018-01-11: 300 mg via ORAL
  Filled 2018-01-11: qty 2

## 2018-01-11 MED ORDER — SULFAMETHOXAZOLE-TRIMETHOPRIM 800-160 MG PO TABS: 1.0000 | ORAL_TABLET | Freq: Once | ORAL | Status: DC
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
## 2018-01-11 NOTE — ED Triage Notes (Signed)
Pt reports pain, redness and swelling left upper thigh since Thursday. Reports he had been in woods. Pain increase with standing

## 2018-01-11 NOTE — ED Notes (Signed)
Pt out in woods on Thursday.  Notice swelling on Thursday night.  Swelling has been increasing.  Area red and warm to touch.  History of heart stents 2016 and left particle knee placement 2017.  Skin marker at bedside.

## 2018-01-11 NOTE — Discharge Instructions (Signed)
I think it is unlikely that you have a blood clot in your leg that would present in this manner.  It seems to be more consistent with a skin infection.   As discussed I would expect this to start improving no later than 72 hours but still may take longer than a week for it to totally go away.   If it continues to get worse past the 36-48-hour mark (after your first dose of antibiotics) please see your primary doctor or return here for repeat evaluation. Please return here or see your doctor if you have fever, malaise, vomiting or other worsening symptoms.
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
## 2018-10-29 NOTE — Patient Instructions (Addendum)
YOU NEED TO HAVE A COVID 19 TEST ON 11/01/2018 @ 3:55 pm. THIS TEST MUST BE DONE BEFORE SURGERY, COME to [place], [address] , [id number]. ( The old [facility]) ONCE YOUR COVID TEST IS COMPLETED, PLEASE BEGIN THE QUARANTINE INSTRUCTIONS AS OUTLINED IN YOUR HANDOUT.                ARA MANO  10/29/2018   Your procedure is scheduled on: Thursday  11/04/2018   Report to [facility] Main  Entrance    Report to Admitting at 6:15 am   1 Fate.    Call this number if you have problems the morning of surgery [phone]    Remember: Do not eat food after midnight. You may have Clear Liquids from midnight till 5:45 am day of surgery then nothing by mouth. You need to drink entire G2 drink provided to you during your preop visit at 5:45 am.      [place] Allowed                                                                     Foods Excluded  Coffee and tea, regular and decaf                             liquids that you cannot  Plain Jell-O any favor except red or purple                                           see through such as: Fruit ices (not with fruit pulp)                                     milk, soups, orange juice  Iced Popsicles                                    All solid food Carbonated beverages, regular and diet                                    Cranberry, grape and apple juices Sports drinks like Gatorade Lightly seasoned clear broth or consume(fat free) Sugar, honey syrup  Sample Menu Breakfast                                Lunch                                     Supper Cranberry juice                    Beef broth  Chicken broth Jell-O                                     Grape juice                           Apple juice Coffee or tea                        Jell-O                                      Popsicle                              Coffee or tea                        Coffee or tea  _____________________________________________________________________  BRUSH YOUR TEETH MORNING OF SURGERY AND RINSE YOUR MOUTH OUT, NO CHEWING GUM CANDY OR MINTS.     Take these medicines the morning of surgery with A SIP OF WATER: Carvedilol (Coreg); Fexafenadine (Allegra) if needed; Levothyroxine; Singulair if needed; Omeprazole, and Imodium                                  You may not have any metal on your body including hair pins and              piercings    Do not wear jewelry, cologne, lotions, powders or deodorant                     Men may shave face and neck.   Do not bring valuables to the hospital. [place].  Contacts, dentures or bridgework may not be worn into surgery.     Patients discharged the day of surgery will not be allowed to drive home. IF YOU ARE HAVING SURGERY AND GOING HOME THE SAME DAY, YOU MUST HAVE AN ADULT TO DRIVE YOU HOME AND BE WITH YOU FOR 24 HOURS. YOU MAY GO HOME BY TAXI OR UBER OR ORTHERWISE, BUT AN ADULT MUST ACCOMPANY YOU HOME AND STAY WITH YOU FOR 24 HOURS.   _____________________________________________________________________        [facility] - Preparing for Surgery Before surgery, you can play an important role.  Because skin is not sterile, your skin needs to be as free of germs as possible.  You can reduce the number of germs on your skin by washing with CHG (chlorahexidine gluconate) soap before surgery.  CHG is an antiseptic cleaner which kills germs and bonds with the skin to continue killing germs even after washing. Please DO NOT use if you have an allergy to CHG or antibacterial soaps.  If your skin becomes reddened/irritated stop using the CHG and inform your nurse when you arrive at Short Stay. Do not shave (including legs and underarms) for at least 48 hours prior to the first CHG shower.  You may  shave your face/neck. Please follow these instructions carefully:  1.  Shower with CHG  Soap the night before surgery and the  morning of Surgery.  2.  If you choose to wash your hair, wash your hair first as usual with your  normal  shampoo.  3.  After you shampoo, rinse your hair and body thoroughly to remove the  shampoo.                           4.  Use CHG as you would any other liquid soap.  You can apply chg directly  to the skin and wash                       Gently with a scrungie or clean washcloth.  5.  Apply the CHG Soap to your body ONLY FROM THE NECK DOWN.   Do not use on face/ open                           Wound or open sores. Avoid contact with eyes, ears mouth and genitals (private parts).                       Wash face,  Genitals (private parts) with your normal soap.             6.  Wash thoroughly, paying special attention to the area where your surgery  will be performed.  7.  Thoroughly rinse your body with warm water from the neck down.  8.  DO NOT shower/wash with your normal soap after using and rinsing off  the CHG Soap.                9.  Pat yourself dry with a clean towel.            10.  Wear clean pajamas.            11.  Place clean sheets on your bed the night of your first shower and do not  sleep with pets. Day of Surgery : Do not apply any lotions/deodorants the morning of surgery.  Please wear clean clothes to the hospital/surgery center.  FAILURE TO FOLLOW THESE INSTRUCTIONS MAY RESULT IN THE CANCELLATION OF YOUR SURGERY PATIENT SIGNATURE_________________________________  NURSE SIGNATURE__________________________________           Incentive Spirometer  An incentive spirometer is a tool that can help keep your lungs clear and active. This tool measures how well you are filling your lungs with each breath. Taking long deep breaths may help reverse or decrease the chance of developing breathing (pulmonary) problems (especially infection) following:  A  long period of time when you are unable to move or be active. BEFORE THE PROCEDURE   If the spirometer includes an indicator to show your best effort, your nurse or respiratory therapist will set it to a desired goal.  If possible, sit up straight or lean slightly forward. Try not to slouch.  Hold the incentive spirometer in an upright position. INSTRUCTIONS FOR USE  1. Sit on the edge of your bed if possible, or sit up as far as you can in bed or on a chair. 2. Hold the incentive spirometer in an upright position. 3. Breathe out normally. 4. Place the mouthpiece in your mouth and seal your lips tightly around it. 5. Breathe in slowly and as deeply as possible, raising the piston or the ball toward the top of the  column. 6. Hold your breath for 3-5 seconds or for as long as possible. Allow the piston or ball to fall to the bottom of the column. 7. Remove the mouthpiece from your mouth and breathe out normally. 8. Rest for a few seconds and repeat Steps 1 through 7 at least 10 times every 1-2 hours when you are awake. Take your time and take a few normal breaths between deep breaths. 9. The spirometer may include an indicator to show your best effort. Use the indicator as a goal to work toward during each repetition. 10. After each set of 10 deep breaths, practice coughing to be sure your lungs are clear. If you have an incision (the cut made at the time of surgery), support your incision when coughing by placing a pillow or rolled up towels firmly against it. Once you are able to get out of bed, walk around indoors and cough well. You may stop using the incentive spirometer when instructed by your caregiver.  RISKS AND COMPLICATIONS  Take your time so you do not get dizzy or light-headed.  If you are in pain, you may need to take or ask for pain medication before doing incentive spirometry. It is harder to take a deep breath if you are having pain. AFTER USE  Rest and breathe slowly and  easily.  It can be helpful to keep track of a log of your progress. Your caregiver can provide you with a simple table to help with this. If you are using the spirometer at home, follow these instructions: [place] IF:   You are having difficultly using the spirometer.  You have trouble using the spirometer as often as instructed.  Your pain medication is not giving enough relief while using the spirometer.  You develop fever of 100.5 F (38.1 C) or higher. SEEK IMMEDIATE MEDICAL CARE IF:   You cough up bloody sputum that had not been present before.  You develop fever of 102 F (38.9 C) or greater.  You develop worsening pain at or near the incision site. MAKE SURE YOU:   Understand these instructions.  Will watch your condition.  Will get help right away if you are not doing well or get worse. Document Released: 07/28/2006 Document Revised: 06/09/2011 Document Reviewed: 09/28/2006 ExitCare Patient Information 2014 ExitCare, [place].   ________________________________________________________________________   WHAT IS A BLOOD TRANSFUSION? Blood Transfusion Information  A transfusion is the replacement of blood or some of its parts. Blood is made up of multiple cells which provide different functions.  Red blood cells carry oxygen and are used for blood loss replacement.  White blood cells fight against infection.  Platelets control bleeding.  Plasma helps clot blood.  Other blood products are available for specialized needs, such as hemophilia or other clotting disorders. BEFORE THE TRANSFUSION  Who gives blood for transfusions?   Healthy volunteers who are fully evaluated to make sure their blood is safe. This is blood bank blood. Transfusion therapy is the safest it has ever been in the practice of medicine. Before blood is taken from a donor, a complete history is taken to make sure that person has no history of diseases nor engages in risky social  behavior (examples are intravenous drug use or sexual activity with multiple partners). The donor's travel history is screened to minimize risk of transmitting infections, such as malaria. The donated blood is tested for signs of infectious diseases, such as HIV and hepatitis. The blood is then tested to be  sure it is compatible with you in order to minimize the chance of a transfusion reaction. If you or a relative donates blood, this is often done in anticipation of surgery and is not appropriate for emergency situations. It takes many days to process the donated blood. RISKS AND COMPLICATIONS Although transfusion therapy is very safe and saves many lives, the main dangers of transfusion include:   Getting an infectious disease.  Developing a transfusion reaction. This is an allergic reaction to something in the blood you were given. Every precaution is taken to prevent this. The decision to have a blood transfusion has been considered carefully by your caregiver before blood is given. Blood is not given unless the benefits outweigh the risks. AFTER THE TRANSFUSION  Right after receiving a blood transfusion, you will usually feel much better and more energetic. This is especially true if your red blood cells have gotten low (anemic). The transfusion raises the level of the red blood cells which carry oxygen, and this usually causes an energy increase.  The nurse administering the transfusion will monitor you carefully for complications. HOME CARE INSTRUCTIONS  No special instructions are needed after a transfusion. You may find your energy is better. Speak with your caregiver about any limitations on activity for underlying diseases you may have. SEEK MEDICAL CARE IF:   Your condition is not improving after your transfusion.  You develop redness or irritation at the intravenous (IV) site. SEEK IMMEDIATE MEDICAL CARE IF:  Any of the following symptoms occur over the next 12 hours:  Shaking  chills.  You have a temperature by mouth above 102 F (38.9 C), not controlled by medicine.  Chest, back, or muscle pain.  People around you feel you are not acting correctly or are confused.  Shortness of breath or difficulty breathing.  Dizziness and fainting.  You get a rash or develop hives.  You have a decrease in urine output.  Your urine turns a dark color or changes to pink, red, or brown. Any of the following symptoms occur over the next 10 days:  You have a temperature by mouth above 102 F (38.9 C), not controlled by medicine.  Shortness of breath.  Weakness after normal activity.  The white part of the eye turns yellow (jaundice).  You have a decrease in the amount of urine or are urinating less often.  Your urine turns a dark color or changes to pink, red, or brown. Document Released: 03/14/2000 Document Revised: 06/09/2011 Document Reviewed: 11/01/2007 [facility] Patient Information 2014 [place].

## 2018-11-01 ENCOUNTER — Encounter (INDEPENDENT_AMBULATORY_CARE_PROVIDER_SITE_OTHER): Payer: Self-pay

## 2018-11-01 ENCOUNTER — Encounter (HOSPITAL_COMMUNITY): Payer: Self-pay

## 2018-11-01 ENCOUNTER — Other Ambulatory Visit (HOSPITAL_COMMUNITY)
Admission: RE | Admit: 2018-11-01 | Discharge: 2018-11-01 | Disposition: A | Discharge status: Home / Self Care | Payer: Medicare Other | Source: Ambulatory Visit | Attending: Orthopedic Surgery | Admitting: Orthopedic Surgery

## 2018-11-01 ENCOUNTER — Other Ambulatory Visit: Payer: Self-pay

## 2018-11-01 ENCOUNTER — Encounter (HOSPITAL_COMMUNITY)
Admission: RE | Admit: 2018-11-01 | Discharge: 2018-11-01 | Disposition: A | Discharge status: Home / Self Care | Payer: Medicare Other | Source: Ambulatory Visit | Attending: Orthopedic Surgery | Admitting: Orthopedic Surgery

## 2018-11-01 DIAGNOSIS — R7303 Prediabetes: Secondary | ICD-10-CM | POA: Insufficient documentation

## 2018-11-01 DIAGNOSIS — Z96652 Presence of left artificial knee joint: Secondary | ICD-10-CM | POA: Diagnosis not present

## 2018-11-01 DIAGNOSIS — F419 Anxiety disorder, unspecified: Secondary | ICD-10-CM | POA: Insufficient documentation

## 2018-11-01 DIAGNOSIS — I10 Essential (primary) hypertension: Secondary | ICD-10-CM | POA: Diagnosis not present

## 2018-11-01 DIAGNOSIS — E785 Hyperlipidemia, unspecified: Secondary | ICD-10-CM | POA: Insufficient documentation

## 2018-11-01 DIAGNOSIS — Z923 Personal history of irradiation: Secondary | ICD-10-CM | POA: Diagnosis not present

## 2018-11-01 DIAGNOSIS — Z87891 Personal history of nicotine dependence: Secondary | ICD-10-CM | POA: Diagnosis not present

## 2018-11-01 DIAGNOSIS — Z8572 Personal history of non-Hodgkin lymphomas: Secondary | ICD-10-CM | POA: Diagnosis not present

## 2018-11-01 DIAGNOSIS — Z20828 Contact with and (suspected) exposure to other viral communicable diseases: Secondary | ICD-10-CM | POA: Diagnosis not present

## 2018-11-01 DIAGNOSIS — I2510 Atherosclerotic heart disease of native coronary artery without angina pectoris: Secondary | ICD-10-CM | POA: Diagnosis not present

## 2018-11-01 DIAGNOSIS — Z01818 Encounter for other preprocedural examination: Secondary | ICD-10-CM | POA: Diagnosis present

## 2018-11-01 DIAGNOSIS — Z79890 Hormone replacement therapy (postmenopausal): Secondary | ICD-10-CM | POA: Diagnosis not present

## 2018-11-01 DIAGNOSIS — M1711 Unilateral primary osteoarthritis, right knee: Secondary | ICD-10-CM | POA: Diagnosis not present

## 2018-11-01 DIAGNOSIS — Z955 Presence of coronary angioplasty implant and graft: Secondary | ICD-10-CM | POA: Insufficient documentation

## 2018-11-01 DIAGNOSIS — E039 Hypothyroidism, unspecified: Secondary | ICD-10-CM | POA: Insufficient documentation

## 2018-11-01 DIAGNOSIS — K219 Gastro-esophageal reflux disease without esophagitis: Secondary | ICD-10-CM | POA: Insufficient documentation

## 2018-11-01 DIAGNOSIS — Z7902 Long term (current) use of antithrombotics/antiplatelets: Secondary | ICD-10-CM | POA: Diagnosis not present

## 2018-11-01 DIAGNOSIS — Z79899 Other long term (current) drug therapy: Secondary | ICD-10-CM | POA: Insufficient documentation

## 2018-11-01 DIAGNOSIS — Z7982 Long term (current) use of aspirin: Secondary | ICD-10-CM | POA: Diagnosis not present

## 2018-11-01 DIAGNOSIS — Z9221 Personal history of antineoplastic chemotherapy: Secondary | ICD-10-CM | POA: Insufficient documentation

## 2018-11-01 LAB — BASIC METABOLIC PANEL
Anion gap: 9 (ref 5–15)
BUN: 16 mg/dL (ref 6–20)
CO2: 26 mmol/L (ref 22–32)
Calcium: 9.1 mg/dL (ref 8.9–10.3)
Chloride: 107 mmol/L (ref 98–111)
Creatinine, Ser: 0.72 mg/dL (ref 0.61–1.24)
GFR calc Af Amer: >60 mL/min (ref >60)
GFR calc non Af Amer: >60 mL/min (ref >60)
Glucose, Bld: 102 mg/dL — ABNORMAL HIGH (ref 70–99)
Potassium: 4.5 mmol/L (ref 3.5–5.1)
Sodium: 142 mmol/L (ref 135–145)

## 2018-11-01 LAB — CBC
HCT: 41.3 % (ref 39.0–52.0)
Hemoglobin: 13.7 g/dL (ref 13.0–17.0)
MCH: 31.1 pg (ref 26.0–34.0)
MCHC: 33.2 g/dL (ref 30.0–36.0)
MCV: 93.9 fL (ref 80.0–100.0)
Platelets: 245 10*3/uL (ref 150–400)
RBC: 4.4 MIL/uL (ref 4.22–5.81)
RDW: 12.7 % (ref 11.5–15.5)
WBC: 4.9 10*3/uL (ref 4.0–10.5)
nRBC: 0 % (ref 0.0–0.2)

## 2018-11-01 LAB — SURGICAL PCR SCREEN
MRSA, PCR: NEGATIVE
Staphylococcus aureus: NEGATIVE

## 2018-11-01 LAB — HEMOGLOBIN A1C
Hgb A1c MFr Bld: 5.6 % (ref 4.8–5.6)
Mean Plasma Glucose: 114.02 mg/dL

## 2018-11-01 LAB — PROTIME-INR
INR: 0.8 (ref 0.8–1.2)
Prothrombin Time: 11.4 seconds (ref 11.4–15.2)

## 2018-11-01 LAB — SARS CORONAVIRUS 2 (TAT 6-24 HRS): SARS Coronavirus 2: NEGATIVE

## 2018-11-03 NOTE — Anesthesia Preprocedure Evaluation (Addendum)
Anesthesia Evaluation  Patient identified by MRN, date of birth, ID band Patient awake    Reviewed: Allergy & Precautions, NPO status , Patient's Chart, lab work & pertinent test results  Airway Mallampati: II  TM Distance: >3 FB Neck ROM: Full    Dental  (+) Missing,    Pulmonary former smoker,    breath sounds clear to auscultation       Cardiovascular hypertension, Pt. on medications and Pt. on home beta blockers + CAD, + Past MI and + Cardiac Stents   Rhythm:Regular Rate:Normal     Neuro/Psych Anxiety negative neurological ROS     GI/Hepatic Neg liver ROS, GERD  Medicated,  Endo/Other  Hypothyroidism   Renal/GU negative Renal ROS     Musculoskeletal  (+) Arthritis ,   Abdominal Normal abdominal exam  (+)   Peds  Hematology negative hematology ROS (+)   Anesthesia Other Findings - Non Hodgkin's Lymphoma  Reproductive/Obstetrics                          Anesthesia Physical Anesthesia Plan  ASA: III  Anesthesia Plan: General   Post-op Pain Management: GA combined w/ Regional for post-op pain   Induction: Intravenous  PONV Risk Score and Plan: 2 and Ondansetron, Dexamethasone and Midazolam  Airway Management Planned: LMA  Additional Equipment: None  Intra-op Plan:   Post-operative Plan: Extubation in OR  Informed Consent: I have reviewed the patients History and Physical, chart, labs and discussed the procedure including the risks, benefits and alternatives for the proposed anesthesia with the patient or authorized representative who has indicated his/her understanding and acceptance.     Dental advisory given  Plan Discussed with: CRNA  Anesthesia Plan Comments: (See PAT note 11/01/2018, Konrad Felix, PA-C  COVID-19 Labs  No results for input(s): DDIMER, FERRITIN, LDH, CRP in the last 72 hours.  Lab Results      Component                Value               Date                       [id number]              NEGATIVE            11/01/2018            )      Anesthesia Quick Evaluation
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
## 2018-11-03 NOTE — Progress Notes (Signed)
09-28-18 Clearance on chart from Westley Chandler, [facility]

## 2018-11-04 ENCOUNTER — Ambulatory Visit (HOSPITAL_COMMUNITY): Payer: Medicare Other | Admitting: Anesthesiology

## 2018-11-04 ENCOUNTER — Encounter (HOSPITAL_COMMUNITY)
Admission: RE | Disposition: A | Discharge status: Home / Self Care | Payer: Self-pay | Source: Home / Self Care | Attending: Orthopedic Surgery

## 2018-11-04 ENCOUNTER — Ambulatory Visit (HOSPITAL_COMMUNITY)
Admission: RE | Admit: 2018-11-04 | Discharge: 2018-11-04 | Disposition: A | Discharge status: Home / Self Care | Payer: Medicare Other | Attending: Orthopedic Surgery | Admitting: Orthopedic Surgery

## 2018-11-04 ENCOUNTER — Other Ambulatory Visit: Payer: Self-pay

## 2018-11-04 ENCOUNTER — Encounter (HOSPITAL_COMMUNITY): Payer: Self-pay

## 2018-11-04 ENCOUNTER — Ambulatory Visit (HOSPITAL_COMMUNITY): Payer: Medicare Other | Admitting: Physician Assistant

## 2018-11-04 DIAGNOSIS — M1711 Unilateral primary osteoarthritis, right knee: Secondary | ICD-10-CM | POA: Diagnosis present

## 2018-11-04 DIAGNOSIS — Z8572 Personal history of non-Hodgkin lymphomas: Secondary | ICD-10-CM | POA: Diagnosis not present

## 2018-11-04 DIAGNOSIS — Z7902 Long term (current) use of antithrombotics/antiplatelets: Secondary | ICD-10-CM | POA: Diagnosis not present

## 2018-11-04 DIAGNOSIS — Z96652 Presence of left artificial knee joint: Secondary | ICD-10-CM | POA: Insufficient documentation

## 2018-11-04 DIAGNOSIS — Z7982 Long term (current) use of aspirin: Secondary | ICD-10-CM | POA: Diagnosis not present

## 2018-11-04 DIAGNOSIS — I252 Old myocardial infarction: Secondary | ICD-10-CM | POA: Insufficient documentation

## 2018-11-04 DIAGNOSIS — E785 Hyperlipidemia, unspecified: Secondary | ICD-10-CM | POA: Insufficient documentation

## 2018-11-04 DIAGNOSIS — Z9221 Personal history of antineoplastic chemotherapy: Secondary | ICD-10-CM | POA: Diagnosis not present

## 2018-11-04 DIAGNOSIS — K219 Gastro-esophageal reflux disease without esophagitis: Secondary | ICD-10-CM | POA: Insufficient documentation

## 2018-11-04 DIAGNOSIS — Z79899 Other long term (current) drug therapy: Secondary | ICD-10-CM | POA: Diagnosis not present

## 2018-11-04 DIAGNOSIS — Z923 Personal history of irradiation: Secondary | ICD-10-CM | POA: Diagnosis not present

## 2018-11-04 DIAGNOSIS — Z96651 Presence of right artificial knee joint: Secondary | ICD-10-CM

## 2018-11-04 DIAGNOSIS — E039 Hypothyroidism, unspecified: Secondary | ICD-10-CM | POA: Insufficient documentation

## 2018-11-04 DIAGNOSIS — I10 Essential (primary) hypertension: Secondary | ICD-10-CM | POA: Insufficient documentation

## 2018-11-04 DIAGNOSIS — Z79890 Hormone replacement therapy (postmenopausal): Secondary | ICD-10-CM | POA: Diagnosis not present

## 2018-11-04 DIAGNOSIS — I2510 Atherosclerotic heart disease of native coronary artery without angina pectoris: Secondary | ICD-10-CM | POA: Insufficient documentation

## 2018-11-04 DIAGNOSIS — Z955 Presence of coronary angioplasty implant and graft: Secondary | ICD-10-CM | POA: Insufficient documentation

## 2018-11-04 DIAGNOSIS — Z87891 Personal history of nicotine dependence: Secondary | ICD-10-CM | POA: Diagnosis not present

## 2018-11-04 HISTORY — PX: PARTIAL KNEE ARTHROPLASTY: SHX2174

## 2018-11-04 LAB — TYPE AND SCREEN
ABO/RH(D): O POS
Antibody Screen: NEGATIVE

## 2018-11-04 SURGERY — ARTHROPLASTY, KNEE, UNICOMPARTMENTAL
Anesthesia: General | Site: Knee | Laterality: Right

## 2018-11-04 MED ORDER — FENTANYL CITRATE (PF) 250 MCG/5ML IJ SOLN
INTRAMUSCULAR | Status: DC | PRN
  Administered 2018-11-04 (×3): 50 ug via INTRAVENOUS

## 2018-11-04 MED ORDER — KETOROLAC TROMETHAMINE 30 MG/ML IJ SOLN
INTRAMUSCULAR | Status: DC | PRN
  Administered 2018-11-04: 30 mg via INTRA_ARTICULAR

## 2018-11-04 MED ORDER — MIDAZOLAM HCL 2 MG/2ML IJ SOLN
1.0000 mg | INTRAMUSCULAR | Status: DC
  Administered 2018-11-04: 2 mg via INTRAVENOUS
  Filled 2018-11-04: qty 2

## 2018-11-04 MED ORDER — LACTATED RINGERS IV SOLN
INTRAVENOUS | Status: DC
  Administered 2018-11-04 (×3): via INTRAVENOUS

## 2018-11-04 MED ORDER — SODIUM CHLORIDE 0.9 % IV BOLUS
250.0000 mL | Freq: Once | INTRAVENOUS | Status: AC
  Administered 2018-11-04: 250 mL via INTRAVENOUS

## 2018-11-04 MED ORDER — FENTANYL CITRATE (PF) 100 MCG/2ML IJ SOLN: 25.0000 ug | INTRAMUSCULAR | Status: DC | PRN

## 2018-11-04 MED ORDER — FENTANYL CITRATE (PF) 250 MCG/5ML IJ SOLN
INTRAMUSCULAR | Status: AC
  Filled 2018-11-04: qty 5

## 2018-11-04 MED ORDER — METHOCARBAMOL 500 MG PO TABS: 500.0000 mg | ORAL_TABLET | Freq: Four times a day (QID) | ORAL | 0 refills | Status: DC | PRN

## 2018-11-04 MED ORDER — MEPERIDINE HCL 50 MG/ML IJ SOLN: 6.2500 mg | INTRAMUSCULAR | Status: DC | PRN

## 2018-11-04 MED ORDER — BUPIVACAINE-EPINEPHRINE 0.25% -1:200000 IJ SOLN
INTRAMUSCULAR | Status: DC | PRN
  Administered 2018-11-04: 30 mL

## 2018-11-04 MED ORDER — CEFAZOLIN SODIUM-DEXTROSE 2-4 GM/100ML-% IV SOLN
2.0000 g | Freq: Once | INTRAVENOUS | Status: AC
  Administered 2018-11-04: 2 g via INTRAVENOUS

## 2018-11-04 MED ORDER — BUPIVACAINE-EPINEPHRINE (PF) 0.25% -1:200000 IJ SOLN
INTRAMUSCULAR | Status: AC
  Filled 2018-11-04: qty 30

## 2018-11-04 MED ORDER — MIDAZOLAM HCL 2 MG/2ML IJ SOLN
INTRAMUSCULAR | Status: DC | PRN
  Administered 2018-11-04: 2 mg via INTRAVENOUS

## 2018-11-04 MED ORDER — ROPIVACAINE HCL 7.5 MG/ML IJ SOLN
INTRAMUSCULAR | Status: DC | PRN
  Administered 2018-11-04: 20 mL via PERINEURAL

## 2018-11-04 MED ORDER — CEFAZOLIN SODIUM-DEXTROSE 2-4 GM/100ML-% IV SOLN
2.0000 g | INTRAVENOUS | Status: AC
  Administered 2018-11-04: 2 g via INTRAVENOUS
  Filled 2018-11-04: qty 100

## 2018-11-04 MED ORDER — SODIUM CHLORIDE (PF) 0.9 % IJ SOLN
INTRAMUSCULAR | Status: DC | PRN
  Administered 2018-11-04: 30 mL

## 2018-11-04 MED ORDER — CHLORHEXIDINE GLUCONATE 4 % EX LIQD: 60.0000 mL | Freq: Once | CUTANEOUS | Status: DC

## 2018-11-04 MED ORDER — LIDOCAINE 2% (20 MG/ML) 5 ML SYRINGE
INTRAMUSCULAR | Status: DC | PRN
  Administered 2018-11-04: 60 mg via INTRAVENOUS
  Administered 2018-11-04: 40 mg via INTRAVENOUS

## 2018-11-04 MED ORDER — ACETAMINOPHEN 10 MG/ML IV SOLN: 1000.0000 mg | Freq: Once | INTRAVENOUS | Status: DC | PRN

## 2018-11-04 MED ORDER — POVIDONE-IODINE 10 % EX SWAB
2.0000 "application " | Freq: Once | CUTANEOUS | Status: AC
  Administered 2018-11-04: 2 via TOPICAL

## 2018-11-04 MED ORDER — POLYETHYLENE GLYCOL 3350 17 G PO PACK: 17.0000 g | PACK | Freq: Two times a day (BID) | ORAL | 0 refills | Status: DC

## 2018-11-04 MED ORDER — PROMETHAZINE HCL 25 MG/ML IJ SOLN: 6.2500 mg | INTRAMUSCULAR | Status: DC | PRN

## 2018-11-04 MED ORDER — HYDROMORPHONE HCL 1 MG/ML IJ SOLN: 0.2500 mg | INTRAMUSCULAR | Status: DC | PRN

## 2018-11-04 MED ORDER — SODIUM CHLORIDE (PF) 0.9 % IJ SOLN
INTRAMUSCULAR | Status: AC
  Filled 2018-11-04: qty 50

## 2018-11-04 MED ORDER — HYDROCODONE-ACETAMINOPHEN 7.5-325 MG PO TABS: 1.0000 | ORAL_TABLET | Freq: Four times a day (QID) | ORAL | Status: DC | PRN

## 2018-11-04 MED ORDER — FERROUS SULFATE 325 (65 FE) MG PO TABS: 325.0000 mg | ORAL_TABLET | Freq: Three times a day (TID) | ORAL | 0 refills | Status: DC

## 2018-11-04 MED ORDER — PROPOFOL 10 MG/ML IV BOLUS
INTRAVENOUS | Status: AC
  Filled 2018-11-04: qty 40

## 2018-11-04 MED ORDER — ACETAMINOPHEN 325 MG PO TABS: 325.0000 mg | ORAL_TABLET | Freq: Once | ORAL | Status: DC | PRN

## 2018-11-04 MED ORDER — CEFAZOLIN SODIUM-DEXTROSE 2-4 GM/100ML-% IV SOLN
INTRAVENOUS | Status: AC
  Filled 2018-11-04: qty 100

## 2018-11-04 MED ORDER — TRANEXAMIC ACID-NACL 1000-0.7 MG/100ML-% IV SOLN
1000.0000 mg | Freq: Once | INTRAVENOUS | Status: AC
  Administered 2018-11-04: 1000 mg via INTRAVENOUS
  Filled 2018-11-04: qty 100

## 2018-11-04 MED ORDER — KETOROLAC TROMETHAMINE 30 MG/ML IJ SOLN
INTRAMUSCULAR | Status: AC
  Filled 2018-11-04: qty 1

## 2018-11-04 MED ORDER — FENTANYL CITRATE (PF) 100 MCG/2ML IJ SOLN
INTRAMUSCULAR | Status: AC
  Filled 2018-11-04: qty 2

## 2018-11-04 MED ORDER — MIDAZOLAM HCL 2 MG/2ML IJ SOLN
INTRAMUSCULAR | Status: AC
  Filled 2018-11-04: qty 2

## 2018-11-04 MED ORDER — DOCUSATE SODIUM 100 MG PO CAPS: 100.0000 mg | ORAL_CAPSULE | Freq: Two times a day (BID) | ORAL | 0 refills | Status: DC

## 2018-11-04 MED ORDER — ONDANSETRON HCL 4 MG/2ML IJ SOLN
INTRAMUSCULAR | Status: DC | PRN
  Administered 2018-11-04: 4 mg via INTRAVENOUS

## 2018-11-04 MED ORDER — FENTANYL CITRATE (PF) 100 MCG/2ML IJ SOLN
50.0000 ug | INTRAMUSCULAR | Status: DC
  Administered 2018-11-04: 50 ug via INTRAVENOUS
  Filled 2018-11-04: qty 2

## 2018-11-04 MED ORDER — ACETAMINOPHEN 160 MG/5ML PO SOLN: 325.0000 mg | Freq: Once | ORAL | Status: DC | PRN

## 2018-11-04 MED ORDER — HYDROCODONE-ACETAMINOPHEN 7.5-325 MG PO TABS: 1.0000 | ORAL_TABLET | ORAL | 0 refills | Status: DC | PRN

## 2018-11-04 MED ORDER — DEXAMETHASONE SODIUM PHOSPHATE 10 MG/ML IJ SOLN
10.0000 mg | Freq: Once | INTRAMUSCULAR | Status: AC
  Administered 2018-11-04: 11:00:00 10 mg via INTRAVENOUS

## 2018-11-04 MED ORDER — LACTATED RINGERS IV SOLN: INTRAVENOUS | Status: DC

## 2018-11-04 MED ORDER — TRANEXAMIC ACID-NACL 1000-0.7 MG/100ML-% IV SOLN
1000.0000 mg | INTRAVENOUS | Status: AC
  Administered 2018-11-04: 1000 mg via INTRAVENOUS
  Filled 2018-11-04: qty 100

## 2018-11-04 MED ORDER — PROPOFOL 10 MG/ML IV BOLUS
INTRAVENOUS | Status: DC | PRN
  Administered 2018-11-04: 150 mg via INTRAVENOUS

## 2018-11-04 MED ORDER — EPHEDRINE SULFATE-NACL 50-0.9 MG/10ML-% IV SOSY
PREFILLED_SYRINGE | INTRAVENOUS | Status: DC | PRN
  Administered 2018-11-04 (×3): 10 mg via INTRAVENOUS
  Administered 2018-11-04: 5 mg via INTRAVENOUS
  Administered 2018-11-04: 10 mg via INTRAVENOUS
  Administered 2018-11-04: 5 mg via INTRAVENOUS

## 2018-11-04 SURGICAL SUPPLY — 46 items
BAG ZIPLOCK 12X15 (MISCELLANEOUS) IMPLANT
BEARING TIB MENISCAL RT SM 4MM (Joint) ×1 IMPLANT
BLADE SAW RECIPROCATING 77.5 (BLADE) ×3 IMPLANT
BLADE SAW SGTL 13.0X1.19X90.0M (BLADE) ×3 IMPLANT
BNDG ELASTIC 6X5.8 VLCR STR LF (GAUZE/BANDAGES/DRESSINGS) ×3 IMPLANT
BOWL SMART MIX CTS (DISPOSABLE) ×3 IMPLANT
CEMENT BONE R 1X40 (Cement) ×3 IMPLANT
COVER SURGICAL LIGHT HANDLE (MISCELLANEOUS) ×3 IMPLANT
COVER WAND RF STERILE (DRAPES) IMPLANT
CUFF TOURN SGL QUICK 34 (TOURNIQUET CUFF) ×2
CUFF TRNQT CYL 34X4.125X (TOURNIQUET CUFF) ×1 IMPLANT
DECANTER SPIKE VIAL GLASS SM (MISCELLANEOUS) ×6 IMPLANT
DERMABOND ADVANCED (GAUZE/BANDAGES/DRESSINGS) ×2
DERMABOND ADVANCED .7 DNX12 (GAUZE/BANDAGES/DRESSINGS) ×1 IMPLANT
DRAPE U-SHAPE 47X51 STRL (DRAPES) ×3 IMPLANT
DRESSING AQUACEL AG SP 3.5X10 (GAUZE/BANDAGES/DRESSINGS) ×1 IMPLANT
DRSG AQUACEL AG SP 3.5X10 (GAUZE/BANDAGES/DRESSINGS) ×3
DURAPREP 26ML APPLICATOR (WOUND CARE) ×3 IMPLANT
ELECT REM PT RETURN 15FT ADLT (MISCELLANEOUS) ×3 IMPLANT
GLOVE BIOGEL PI IND STRL 7.5 (GLOVE) ×1 IMPLANT
GLOVE BIOGEL PI IND STRL 8.5 (GLOVE) ×1 IMPLANT
GLOVE BIOGEL PI INDICATOR 7.5 (GLOVE) ×2
GLOVE BIOGEL PI INDICATOR 8.5 (GLOVE) ×2
GLOVE ECLIPSE 8.0 STRL XLNG CF (GLOVE) ×3 IMPLANT
GLOVE ORTHO TXT STRL SZ7.5 (GLOVE) ×6 IMPLANT
GOWN STRL REUS W/TWL 2XL LVL3 (GOWN DISPOSABLE) ×3 IMPLANT
GOWN STRL REUS W/TWL LRG LVL3 (GOWN DISPOSABLE) ×3 IMPLANT
HOLDER FOLEY CATH W/STRAP (MISCELLANEOUS) IMPLANT
KIT TURNOVER KIT A (KITS) IMPLANT
KNEE UNICOMP MEDIAL OXFORD RT (Joint) ×3 IMPLANT
LEGGING LITHOTOMY PAIR STRL (DRAPES) ×3 IMPLANT
MANIFOLD NEPTUNE II (INSTRUMENTS) ×3 IMPLANT
NDL SAFETY ECLIPSE 18X1.5 (NEEDLE) ×1 IMPLANT
NEEDLE HYPO 18GX1.5 SHARP (NEEDLE) ×2
PACK ICE MAXI GEL EZY WRAP (MISCELLANEOUS) ×3 IMPLANT
PACK TOTAL KNEE CUSTOM (KITS) ×3 IMPLANT
PEG FEMORAL PEGGED STRL SM (Knees) ×3 IMPLANT
SUT MNCRL AB 4-0 PS2 18 (SUTURE) ×3 IMPLANT
SUT STRATAFIX PDS+ 0 24IN (SUTURE) ×3 IMPLANT
SUT VIC AB 1 CT1 36 (SUTURE) ×3 IMPLANT
SUT VIC AB 2-0 CT1 27 (SUTURE) ×4
SUT VIC AB 2-0 CT1 TAPERPNT 27 (SUTURE) ×2 IMPLANT
SYR 3ML LL SCALE MARK (SYRINGE) ×3 IMPLANT
TRAY FOLEY MTR SLVR 16FR STAT (SET/KITS/TRAYS/PACK) ×3 IMPLANT
UNICOMPARTMENTAL KNEE MENISCAL (Joint) ×3 IMPLANT
YANKAUER SUCT BULB TIP NO VENT (SUCTIONS) ×3 IMPLANT

## 2018-11-04 NOTE — Transfer of Care (Signed)
Immediate Anesthesia Transfer of Care Note  Patient: Brett Olson  Procedure(s) Performed: RIGHT UNICOMPARTMENTAL KNEE ARTHROPLASTY- MEDIALLY (Right Knee)  Patient Location: PACU  Anesthesia Type:General  Level of Consciousness: awake  Airway & Oxygen Therapy: Patient Spontanous Breathing and Patient connected to face mask oxygen  Post-op Assessment: Report given to RN and Post -op Vital signs reviewed and stable  Post vital signs: Reviewed and stable  Last Vitals:  Vitals Value Taken Time  BP 151/95 11/04/18 1240  Temp    Pulse 78 11/04/18 1242  Resp 10 11/04/18 1242  SpO2 100 % 11/04/18 1242  Vitals shown include unvalidated device data.  Last Pain:  Vitals:   11/04/18 0659  TempSrc:   PainSc: 0-No pain         Complications: No apparent anesthesia complications

## 2018-11-04 NOTE — Discharge Instructions (Addendum)

## 2018-11-04 NOTE — Interval H&P Note (Signed)
History and Physical Interval Note:  11/04/2018 7:11 AM  Brett Olson  has presented today for surgery, with the diagnosis of Right knee medial compartmental osteoarthritis.  The various methods of treatment have been discussed with the patient and family. After consideration of risks, benefits and other options for treatment, the patient has consented to  Procedure(s) with comments: UNICOMPARTMENTAL KNEE- MEDIALLY (Right) - 90 mins as a surgical intervention.  The patient's history has been reviewed, patient examined, no change in status, stable for surgery.  I have reviewed the patient's chart and labs.  Questions were answered to the patient's satisfaction.     Mauri Pole

## 2018-11-04 NOTE — Evaluation (Signed)
Physical Therapy Evaluation Patient Details Name: Brett Olson MRN: [id number] DOB: 1965-09-30 Today's Date: 11/04/2018   History of Present Illness  R UKR with hx of L UKR, MI, CAD, and back surgery  Clinical Impression  Pt s/p R UKR and presents with decreased R LE strength/ROM and post op pain limiting functional mobility.  Pt should progress to dc home with family assist and states he has first OP PT 11/08/18.  Home therex program supplied in writing.    Follow Up Recommendations Outpatient PT    Equipment Recommendations  None recommended by PT    Recommendations for Other Services       Precautions / Restrictions Precautions Precautions: Fall Restrictions Weight Bearing Restrictions: No      Mobility  Bed Mobility Overal bed mobility: Modified Independent                Transfers Overall transfer level: Needs assistance   Transfers: Sit to/from Stand Sit to Stand: Min guard;Supervision         General transfer comment: cues for LE management and use of UEs to self assist  Ambulation/Gait Ambulation/Gait assistance: Min guard;Supervision Gait Distance (Feet): 140 Feet Assistive device: Rolling walker (2 wheeled) Gait Pattern/deviations: Step-to pattern;Decreased step length - right;Decreased step length - left;Shuffle     General Gait Details: cues for sequence, posture and position from RW;   Stairs Stairs: (pt states comfortable with stairs - has been doing sequence )          Wheelchair Mobility    Modified Rankin (Stroke Patients Only)       Balance                                             Pertinent Vitals/Pain Pain Assessment: 0-10 Pain Score: 1  Pain Location: R knee Pain Descriptors / Indicators: Tightness Pain Intervention(s): Limited activity within patient's tolerance;Monitored during session;Ice applied    Home Living Family/patient expects to be discharged to:: Private residence Living  Arrangements: Spouse/significant other Available Help at Discharge: Family Type of Home: House Home Access: Stairs to enter Entrance Stairs-Rails: Right;Left;Can reach both Entrance Stairs-Number of Steps: 3 Home Layout: Two level;Able to live on main level with bedroom/bathroom Home Equipment: Gilford Rile - 2 wheels;Cane - single point      Prior Function Level of Independence: Independent               Hand Dominance        Extremity/Trunk Assessment   Upper Extremity Assessment Upper Extremity Assessment: Overall WFL for tasks assessed    Lower Extremity Assessment Lower Extremity Assessment: RLE deficits/detail RLE Deficits / Details: 3/5 quads with IND SLR;  AAROM at knee -5 - 100       Communication   Communication: No difficulties  Cognition Arousal/Alertness: Awake/alert Behavior During Therapy: WFL for tasks assessed/performed Overall Cognitive Status: Within Functional Limits for tasks assessed                                        General Comments      Exercises Total Joint Exercises Ankle Circles/Pumps: AROM;Both;15 reps;Supine Quad Sets: AROM;Both;10 reps;Supine Heel Slides: AAROM;Right;15 reps;Supine Straight Leg Raises: AAROM;AROM;Right;10 reps;Supine Long Arc Quad: AAROM;AROM;Right;10 reps;Seated Knee Flexion: AAROM;AROM;Right;10 reps;Seated   Assessment/Plan  PT Assessment Patient needs continued PT services  PT Problem List Decreased strength;Decreased range of motion;Decreased activity tolerance;Decreased mobility;Decreased knowledge of use of DME;Pain       PT Treatment Interventions DME instruction;Gait training;Functional mobility training;Therapeutic activities;Therapeutic exercise;Patient/family education    PT Goals (Current goals can be found in the Care Plan section)  Acute Rehab PT Goals Patient Stated Goal: HOME PT Goal Formulation: All assessment and education complete, DC therapy    Frequency Min  1X/week   Barriers to discharge        Co-evaluation               AM-PAC PT "6 Clicks" Mobility  Outcome Measure Help needed turning from your back to your side while in a flat bed without using bedrails?: None Help needed moving from lying on your back to sitting on the side of a flat bed without using bedrails?: None Help needed moving to and from a bed to a chair (including a wheelchair)?: A Little Help needed standing up from a chair using your arms (e.g., wheelchair or bedside chair)?: A Little Help needed to walk in hospital room?: A Little Help needed climbing 3-5 steps with a railing? : A Little 6 Click Score: 20    End of Session Equipment Utilized During Treatment: Gait belt Activity Tolerance: Patient tolerated treatment well Patient left: in bed;with call bell/phone within reach;with nursing/sitter in room Nurse Communication: Mobility status PT Visit Diagnosis: Difficulty in walking, not elsewhere classified (R26.2)    Time: 1439-1510 PT Time Calculation (min) (ACUTE ONLY): 31 min   Charges:   PT Evaluation $PT Eval Low Complexity: 1 Low          [place] Pager [phone] Office [phone]   Aarsh Fristoe 11/04/2018, 5:03 PM

## 2018-11-04 NOTE — Anesthesia Procedure Notes (Signed)
Anesthesia Regional Block: Adductor canal block   Pre-Anesthetic Checklist: ,, timeout performed, Correct Patient, Correct Site, Correct Laterality, Correct Procedure, Correct Position, site marked, Risks and benefits discussed,  Surgical consent,  Pre-op evaluation,  At surgeon's request and post-op pain management  Laterality: Right  Prep: chloraprep       Needles:  Injection technique: Single-shot  Needle Type: Echogenic Stimulator Needle     Needle Length: 9cm  Needle Gauge: 21     Additional Needles:   Procedures:,,,, ultrasound used (permanent image in chart),,,,  Narrative:  Start time: 11/04/2018 9:00 AM End time: 11/04/2018 9:10 AM Injection made incrementally with aspirations every 5 mL.  Performed by: Personally  Anesthesiologist: Effie Berkshire, MD  Additional Notes: Patient tolerated the procedure well. Local anesthetic introduced in an incremental fashion under minimal resistance after negative aspirations. No paresthesias were elicited. After completion of the procedure, no acute issues were identified and patient continued to be monitored by RN.

## 2018-11-04 NOTE — Anesthesia Procedure Notes (Signed)
Procedure Name: LMA Insertion Date/Time: 11/04/2018 10:54 AM Performed by: Cynda Familia, CRNA Pre-anesthesia Checklist: Patient identified, Emergency Drugs available, Suction available and Patient being monitored Patient Re-evaluated:Patient Re-evaluated prior to induction Oxygen Delivery Method: Circle System Utilized Preoxygenation: Pre-oxygenation with 100% oxygen Induction Type: IV induction Ventilation: Mask ventilation without difficulty LMA: LMA inserted LMA Size: 4.0 Number of attempts: 1 Placement Confirmation: positive ETCO2 and breath sounds checked- equal and bilateral Tube secured with: Tape Dental Injury: Teeth and Oropharynx as per pre-operative assessment  Comments: Smooth IV induction Brett Olson-- LMA inserted AM CRNA atraumatic-- teeth and mouth as preop-- very dentition- many missing teeth/ chipped-- Bilat BS

## 2018-11-04 NOTE — Op Note (Signed)
NAME: Brett Olson    MEDICAL RECORD NO.: [id number]   FACILITY: [facility] BIRTH: Nov 07, 1965  PHYSICIAN: Pietro Cassis. Alvan Dame, M.D.    DATE OF PROCEDURE: 11/04/2018    OPERATIVE REPORT   PREOPERATIVE DIAGNOSIS: Right knee medial compartment osteoarthritis.   POSTOPERATIVE DIAGNOSIS: Right knee medial compartment osteoarthritis.   PROCEDURE: Right partial knee replacement utilizing Biomet Oxford knee  component, size small femur, a right medial size D tibial tray with a 4 mm insert.   SURGEON: Pietro Cassis. Alvan Dame, M.D.   ASSISTANT: Danae Orleans, PAC.  Please note that Mr. [name] was present for the entirety of the case,  utilized for preoperative positioning, perioperative retractor  management, general facilitation of the case and primary wound closure.   ANESTHESIA: Regional and General.   SPECIMENS: None.   COMPLICATIONS: None.  DRAINS: None   TOURNIQUET TIME: 30 minutes at 250 mmHg.   INDICATIONS FOR PROCEDURE: The patient is a 53 y.o. patient of mine who presented for evaluation of right knee pain.  They presented with primary complaints of pain on the medial side of their knee. Radiographs revealed advanced medial compartment arthritis with specifically an antero-medial wear pattern.  There was bone on bone changes noted with subchondral sclerosis and osteophytes present. The patient has had progressive problems failing to respond to conservative measures of medications, injections and activity modification. Risks of infection, DVT, component failure, need for future revision surgery were all discussed and reviewed.  Consent was obtained for benefit of pain relief.   PROCEDURE IN DETAIL: The patient was brought to the operative theater.  Once adequate anesthesia, preoperative antibiotics, 2 gm of Ancef administered, 1 gm of Tranexamic Acid, and 10 mg of Decadron given the patient was positioned in supine position with a right thigh tourniquet placed. The operative leg was  positioned over the Oxford leg holder such to allow for 120 degrees of flexion. The right lower extremity was prepped and draped in sterile fashion. A time-out  was performed identifying the patient, planned procedure, and extremity.  The operative leg was exsanguinated and the tourniquet elevated to 250 mmHg. A midline  incision was made from the proximal pole of the patella to the tibial tubercle. A  soft tissue plane was created and partial median arthrotomy was then  made to allow for subluxation of the patella. Following initial synovectomy and  debridement, the osteophytes were removed off the medial aspect of the knee and within the notch as needed.  The femur was first sized using the sizing spoons and determine to be a size small femur.  The femoral spoon was then left in place to be attached to the extra medullary tibial guide.  The tibial extramedullary guide was positioned over the anterior crest of the tibia  and pinned into position, perpendicular in the coronal plane with appropriate slope.  Utilizing the size 4 G clamp I connected the cutting block tray to the femoral spine.  Upon confirming the orientation of the cutting block as it pertained to the extra medullary guide the tibial tray was pinned in place.  I then used a reciprocating saw along the medial border of the medial tibial spine.  The reciprocating saw was then used to complete the proximal tibial osteotomy.  The proximal tibial bone was removed and identified to have complete loss of cartilage and anterior medial pattern.  The cut surface of the tibia was found to be best covered with the D tibial tray.  At this  point I checked the tension of the medial collateral ligament at 90 degrees.  With the retractors out of the wound and the knee held at 90 degrees the 4 feeler gauge had appropriate tension on the medial ligament.   At this point, the femoral canal was opened with a 6 mm drill followed by the starting awl and then  the  intramedullary was positioned within the canal.  Then the small posterior cutting block guide set at 4 to match the flexion gap was positioned onto the cut surface of the tibia and then linked to the intramedullary rod using the articulating link.  This posterior cutting block guide was positioned over the middle portion of the medial femoral condyle in line with the normal kinematic motion of the knee.  The 2 drill holes were then made into the distal femur.  Once this was done the posterior cutting block guide and length were removed.    The posterior guide was then impacted into place on the distal femur and the posterior  femoral cut made.   At this point, based on the tension on the medial collateral ligament at 90 degrees as previously assessed I selected the size 4 spigot and pushed it to the distal femur.  I then milled the distal femur.  Excess of bone from the distal femur and then medially and laterally were removed using osteotome and rondure.  We now did our first trial reduction with the small femur and the D tibial tray.  I now assessed the ligament balancing at 90 and 20 degrees of flexion.     I found that the tension at 90 degrees was appropriate with the 4 feeler gauge but found that the tension at 20 degrees matched the tension of the medial collateral ligament with the 3 feeler gauge.  Per the protocol based on the mismatch of tensioning at 90 and 20 degrees of flexion I placed the 5 spigot and re-milled the distal femur.  Once the remaining bone off the distal femur was removed a repeat trial reduction was carried out.  At this point I found that the medial collateral ligament tension was symmetric at 90 and 20 degrees of flexion with the 4 feeler gauge.    Given these findings, the trial femoral component was removed. Final preparation of tibia was carried out by pinning it in position. Then  using a reciprocating saw I removed bone for the keel. Further bone was  removed with  an osteotome.  Trial reduction was now carried out with the small femur, the right medial keeled D tibia, and the 4 lollipop insert. The balance of the  ligaments appeared to be symmetric at 20 degrees and 90 degrees. Given  all these findings, the trial components were removed.  The final components were opened. The knee was irrigated with  normal saline solution.  The medial synovial capsule junction of the knee was injected with 30 cc of quarter percent Marcaine with epinephrine, 1 cc of Toradol, and 30 cc of normal saline.  Any final debridements of the soft tissue or osteophytes was carried out at this point.  Sclerotic bone on the distal femur and proximal tibia were drilled with the drill and the set.    The final components were cemented onto a clean and dried cut surfaces of bone. Excessive cement was removed and the knee was then held at 45 degrees of flexion with the size 4 feeler gauge until the cement cured. Excess cement was removed  throughout the knee. Tourniquet was let down  after 30 minutes. After the cement had fully cured and excessive cement  was removed throughout the knee there was no visualized cement present.  Before selecting the final insert re-trialed with the size 4 lollipop insert.  Given that these findings confirmed our initial trial reduction we selected the final size 4 mm, right medial insert to match the smallfemur.  Following irrigation of the knee this final insert was snapped into place.   The extensor mechanism  was then reapproximated using a #1 Vicryl and #1 Stratafix suture with the knee in flexion. The  remaining wound was closed with 2-0 Vicryl and a running 4-0 Monocryl.  The knee was cleaned, dried, and dressed sterilely using Dermabond and  Aquacel dressing. The patient  was brought to the recovery room, Ace wrap in place, tolerating the  procedure well.     Pietro Cassis Alvan Dame, M.D.

## 2018-11-04 NOTE — Progress Notes (Signed)
Assisted Dr. Hollis with right, ultrasound guided, adductor canal block. Side rails up, monitors on throughout procedure. See vital signs in flow sheet. Tolerated Procedure well.  

## 2018-11-04 NOTE — Anesthesia Procedure Notes (Signed)
Date/Time: 11/04/2018 12:32 PM Performed by: Cynda Familia, CRNA Oxygen Delivery Method: Simple face mask Placement Confirmation: positive ETCO2 Dental Injury: Teeth and Oropharynx as per pre-operative assessment

## 2018-11-04 NOTE — Anesthesia Postprocedure Evaluation (Signed)
Anesthesia Post Note  Patient: Brett Olson  Procedure(s) Performed: RIGHT UNICOMPARTMENTAL KNEE ARTHROPLASTY- MEDIALLY (Right Knee)     Patient location during evaluation: PACU Anesthesia Type: General Level of consciousness: awake and alert Pain management: pain level controlled Vital Signs Assessment: post-procedure vital signs reviewed and stable Respiratory status: spontaneous breathing, nonlabored ventilation, respiratory function stable and patient connected to nasal cannula oxygen Cardiovascular status: blood pressure returned to baseline and stable Postop Assessment: no apparent nausea or vomiting Anesthetic complications: no    Last Vitals:  Vitals:   11/04/18 1330 11/04/18 1341  BP: (!) 154/86 (!) 154/89  Pulse: 82 83  Resp: 13 16  Temp:  36.6 C  SpO2: 96% 94%    Last Pain:  Vitals:   11/04/18 1330  TempSrc:   PainSc: 0-No pain                 Effie Berkshire

## 2018-11-05 ENCOUNTER — Encounter (HOSPITAL_COMMUNITY): Payer: Self-pay | Admitting: Orthopedic Surgery

## 2018-11-15 ENCOUNTER — Ambulatory Visit (HOSPITAL_COMMUNITY)
Admission: RE | Admit: 2018-11-15 | Discharge: 2018-11-15 | Disposition: A | Discharge status: Home / Self Care | Payer: Medicare Other | Source: Ambulatory Visit | Attending: Internal Medicine | Admitting: Internal Medicine

## 2018-11-15 ENCOUNTER — Other Ambulatory Visit: Payer: Self-pay

## 2018-11-15 ENCOUNTER — Other Ambulatory Visit (HOSPITAL_COMMUNITY): Payer: Self-pay | Admitting: Orthopedic Surgery

## 2018-11-15 DIAGNOSIS — M79604 Pain in right leg: Secondary | ICD-10-CM

## 2018-11-15 DIAGNOSIS — M79661 Pain in right lower leg: Secondary | ICD-10-CM | POA: Diagnosis not present

## 2018-12-17 ENCOUNTER — Other Ambulatory Visit: Payer: Self-pay

## 2018-12-17 ENCOUNTER — Emergency Department (HOSPITAL_COMMUNITY): Payer: Medicare Other

## 2018-12-17 ENCOUNTER — Emergency Department (HOSPITAL_COMMUNITY)
Admission: EM | Admit: 2018-12-17 | Discharge: 2018-12-17 | Disposition: A | Discharge status: Home / Self Care | Payer: Medicare Other | Attending: Emergency Medicine | Admitting: Emergency Medicine

## 2018-12-17 ENCOUNTER — Encounter (HOSPITAL_COMMUNITY): Payer: Self-pay | Admitting: Emergency Medicine

## 2018-12-17 DIAGNOSIS — I2510 Atherosclerotic heart disease of native coronary artery without angina pectoris: Secondary | ICD-10-CM | POA: Diagnosis not present

## 2018-12-17 DIAGNOSIS — E039 Hypothyroidism, unspecified: Secondary | ICD-10-CM | POA: Diagnosis not present

## 2018-12-17 DIAGNOSIS — Z79899 Other long term (current) drug therapy: Secondary | ICD-10-CM | POA: Diagnosis not present

## 2018-12-17 DIAGNOSIS — M5126 Other intervertebral disc displacement, lumbar region: Secondary | ICD-10-CM | POA: Diagnosis not present

## 2018-12-17 DIAGNOSIS — Z7982 Long term (current) use of aspirin: Secondary | ICD-10-CM | POA: Insufficient documentation

## 2018-12-17 DIAGNOSIS — M5416 Radiculopathy, lumbar region: Secondary | ICD-10-CM | POA: Diagnosis not present

## 2018-12-17 DIAGNOSIS — I10 Essential (primary) hypertension: Secondary | ICD-10-CM | POA: Diagnosis not present

## 2018-12-17 DIAGNOSIS — Z87891 Personal history of nicotine dependence: Secondary | ICD-10-CM | POA: Diagnosis not present

## 2018-12-17 DIAGNOSIS — M25551 Pain in right hip: Secondary | ICD-10-CM | POA: Diagnosis present

## 2018-12-17 HISTORY — DX: Other intervertebral disc degeneration, lumbar region without mention of lumbar back pain or lower extremity pain: M51.369

## 2018-12-17 HISTORY — DX: Other intervertebral disc displacement, lumbar region: M51.26

## 2018-12-17 HISTORY — DX: Other intervertebral disc degeneration, lumbar region: M51.36

## 2018-12-17 LAB — CBC WITH DIFFERENTIAL/PLATELET
Abs Immature Granulocytes: 0.04 10*3/uL (ref 0.00–0.07)
Basophils Absolute: 0 10*3/uL (ref 0.0–0.1)
Basophils Relative: 1 %
Eosinophils Absolute: 0.1 10*3/uL (ref 0.0–0.5)
Eosinophils Relative: 1 %
HCT: 42.2 % (ref 39.0–52.0)
Hemoglobin: 13.6 g/dL (ref 13.0–17.0)
Immature Granulocytes: 1 %
Lymphocytes Relative: 22 %
Lymphs Abs: 1.7 10*3/uL (ref 0.7–4.0)
MCH: 31.1 pg (ref 26.0–34.0)
MCHC: 32.2 g/dL (ref 30.0–36.0)
MCV: 96.6 fL (ref 80.0–100.0)
Monocytes Absolute: 0.8 10*3/uL (ref 0.1–1.0)
Monocytes Relative: 11 %
Neutro Abs: 5 10*3/uL (ref 1.7–7.7)
Neutrophils Relative %: 64 %
Platelets: 308 10*3/uL (ref 150–400)
RBC: 4.37 MIL/uL (ref 4.22–5.81)
RDW: 13 % (ref 11.5–15.5)
WBC: 7.6 10*3/uL (ref 4.0–10.5)
nRBC: 0 % (ref 0.0–0.2)

## 2018-12-17 LAB — BASIC METABOLIC PANEL
Anion gap: 10 (ref 5–15)
BUN: 23 mg/dL — ABNORMAL HIGH (ref 6–20)
CO2: 22 mmol/L (ref 22–32)
Calcium: 8.5 mg/dL — ABNORMAL LOW (ref 8.9–10.3)
Chloride: 103 mmol/L (ref 98–111)
Creatinine, Ser: 0.74 mg/dL (ref 0.61–1.24)
GFR calc Af Amer: >60 mL/min (ref >60)
GFR calc non Af Amer: >60 mL/min (ref >60)
Glucose, Bld: 94 mg/dL (ref 70–99)
Potassium: 4.1 mmol/L (ref 3.5–5.1)
Sodium: 135 mmol/L (ref 135–145)

## 2018-12-17 MED ORDER — OXYCODONE-ACETAMINOPHEN 5-325 MG PO TABS
2.0000 | ORAL_TABLET | Freq: Once | ORAL | Status: AC
  Administered 2018-12-17: 2 via ORAL
  Filled 2018-12-17: qty 2

## 2018-12-17 MED ORDER — OXYCODONE-ACETAMINOPHEN 5-325 MG PO TABS: 1.0000 | ORAL_TABLET | Freq: Four times a day (QID) | ORAL | 0 refills | Status: DC | PRN

## 2018-12-17 MED ORDER — GADOBUTROL 1 MMOL/ML IV SOLN
10.0000 mL | Freq: Once | INTRAVENOUS | Status: AC | PRN
  Administered 2018-12-17: 10 mL via INTRAVENOUS

## 2018-12-17 NOTE — ED Notes (Signed)
Patient to MRI and Radiology.

## 2018-12-17 NOTE — ED Notes (Signed)
Advised patient not to drive after discharge due to narcotic medication administration. Also advised patient not to drive while taking prescription pain medication. Patient verbalized understanding. Discharged via wheelchair with family member to drive him home.

## 2018-12-17 NOTE — ED Triage Notes (Signed)
Patient states he got a "shot of something for my back problems" two days ago in his right hip radiating down his right leg since the shot.

## 2018-12-17 NOTE — Discharge Instructions (Addendum)
Take 1-2 Percocet every 6 hours as needed for severe pain.  Do not combine this with hydrocodone.  Take one or the other.  Do not drive or operate machinery while taking this medication.  Resume gabapentin for right now until you see your doctor next week.  Please return the emergency department you develop any complete numbness of your legs or groin, loss of bowel or bladder control, fevers, or any other concerning symptoms.  Do not drink alcohol, drive, operate machinery or participate in any other potentially dangerous activities while taking opiate pain medication as it may make you sleepy. Do not take this medication with any other sedating medications, either prescription or over-the-counter. If you were prescribed Percocet or Vicodin, do not take these with acetaminophen (Tylenol) as it is already contained within these medications and overdose of Tylenol is dangerous.   This medication is an opiate (or narcotic) pain medication and can be habit forming.  Use it as little as possible to achieve adequate pain control.  Do not use or use it with extreme caution if you have a history of opiate abuse or dependence. This medication is intended for your use only - do not give any to anyone else and keep it in a secure place where nobody else, especially children, have access to it. It will also cause or worsen constipation, so you may want to consider taking an over-the-counter stool softener while you are taking this medication.
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
## 2018-12-17 NOTE — ED Notes (Signed)
Patient to Radiology

## 2020-07-02 ENCOUNTER — Encounter: Payer: Self-pay | Admitting: Internal Medicine

## 2020-12-31 ENCOUNTER — Other Ambulatory Visit: Payer: Self-pay | Admitting: Physical Medicine and Rehabilitation

## 2020-12-31 ENCOUNTER — Other Ambulatory Visit: Payer: Self-pay | Admitting: Internal Medicine

## 2020-12-31 DIAGNOSIS — M5416 Radiculopathy, lumbar region: Secondary | ICD-10-CM

## 2021-01-07 ENCOUNTER — Ambulatory Visit
Admission: RE | Admit: 2021-01-07 | Discharge: 2021-01-07 | Disposition: A | Discharge status: Home / Self Care | Payer: Medicare Other | Source: Ambulatory Visit | Attending: Physical Medicine and Rehabilitation | Admitting: Physical Medicine and Rehabilitation

## 2021-01-07 DIAGNOSIS — M5416 Radiculopathy, lumbar region: Secondary | ICD-10-CM

## 2021-01-07 MED ORDER — METHYLPREDNISOLONE ACETATE 40 MG/ML INJ SUSP (RADIOLOG
80.0000 mg | Freq: Once | INTRAMUSCULAR | Status: AC
  Administered 2021-01-07: 80 mg via EPIDURAL

## 2021-01-07 MED ORDER — IOPAMIDOL (ISOVUE-M 200) INJECTION 41%
1.0000 mL | Freq: Once | INTRAMUSCULAR | Status: AC
  Administered 2021-01-07: 1 mL via EPIDURAL

## 2021-01-07 NOTE — Discharge Instructions (Signed)
Post Procedure Spinal Discharge Instruction Sheet  You may resume a regular diet and any medications that you routinely take (including pain medications) unless otherwise noted by MD.  No driving day of procedure.  Light activity throughout the rest of the day.  Do not do any strenuous work, exercise, bending or lifting.  The day following the procedure, you can resume normal physical activity but you should refrain from exercising or physical therapy for at least three days thereafter.  You may apply ice to the injection site, 20 minutes on, 20 minutes off, as needed. Do not apply ice directly to skin.    Common Side Effects:  Headaches- take your usual medications as directed by your physician.  Increase your fluid intake.  Caffeinated beverages may be helpful.  Lie flat in bed until your headache resolves.  Restlessness or inability to sleep- you may have trouble sleeping for the next few days.  Ask your referring physician if you need any medication for sleep.  Facial flushing or redness- should subside within a few days.  Increased pain- a temporary increase in pain a day or two following your procedure is not unusual.  Take your pain medication as prescribed by your referring physician.  Leg cramps  Please contact our office at [phone] for the following symptoms: Fever greater than 100 degrees. Headaches unresolved with medication after 2-3 days. Increased swelling, pain, or redness at injection site.   Thank you for visiting [facility] today.    YOU MAY RESUME YOUR BRILINTA 24 HOURS AFTER INJECTION

## 2022-05-24 ENCOUNTER — Other Ambulatory Visit: Payer: Self-pay

## 2022-05-24 ENCOUNTER — Encounter (HOSPITAL_COMMUNITY): Payer: Self-pay

## 2022-05-24 ENCOUNTER — Emergency Department (HOSPITAL_COMMUNITY)
Admission: EM | Admit: 2022-05-24 | Discharge: 2022-05-24 | Disposition: A | Discharge status: Home / Self Care | Payer: Medicare Other | Attending: Student | Admitting: Student

## 2022-05-24 DIAGNOSIS — I2510 Atherosclerotic heart disease of native coronary artery without angina pectoris: Secondary | ICD-10-CM | POA: Diagnosis not present

## 2022-05-24 DIAGNOSIS — Z96651 Presence of right artificial knee joint: Secondary | ICD-10-CM | POA: Insufficient documentation

## 2022-05-24 DIAGNOSIS — E039 Hypothyroidism, unspecified: Secondary | ICD-10-CM | POA: Diagnosis not present

## 2022-05-24 DIAGNOSIS — R040 Epistaxis: Secondary | ICD-10-CM | POA: Diagnosis not present

## 2022-05-24 DIAGNOSIS — I10 Essential (primary) hypertension: Secondary | ICD-10-CM | POA: Insufficient documentation

## 2022-05-24 MED ORDER — OXYMETAZOLINE HCL 0.05 % NA SOLN
1.0000 | Freq: Once | NASAL | Status: AC
  Administered 2022-05-24: 1 via NASAL
  Filled 2022-05-24: qty 30

## 2022-05-24 NOTE — ED Triage Notes (Signed)
Pt arrived from home via POV  c/o right sided epistaxis that began around 2300 last night. Pt reports he takes '81mg'$  ASA daily as well as Brilinta. Pt reports he was watching TV then blew his nose. After blowing his nose the epistaxis quickly began.

## 2022-05-24 NOTE — ED Notes (Signed)
ED Provider at bedside. 
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
## 2022-06-17 ENCOUNTER — Other Ambulatory Visit (HOSPITAL_COMMUNITY): Payer: Self-pay | Admitting: Internal Medicine

## 2022-06-17 DIAGNOSIS — R748 Abnormal levels of other serum enzymes: Secondary | ICD-10-CM

## 2022-07-02 ENCOUNTER — Ambulatory Visit (HOSPITAL_COMMUNITY)
Admission: RE | Admit: 2022-07-02 | Discharge: 2022-07-02 | Disposition: A | Discharge status: Home / Self Care | Payer: Medicare Other | Source: Ambulatory Visit | Attending: Internal Medicine | Admitting: Internal Medicine

## 2022-07-02 DIAGNOSIS — R748 Abnormal levels of other serum enzymes: Secondary | ICD-10-CM | POA: Diagnosis not present

## 2023-01-01 ENCOUNTER — Emergency Department (HOSPITAL_COMMUNITY): Payer: Medicare Other

## 2023-01-01 ENCOUNTER — Other Ambulatory Visit: Payer: Self-pay

## 2023-01-01 ENCOUNTER — Encounter (HOSPITAL_COMMUNITY): Payer: Self-pay

## 2023-01-01 ENCOUNTER — Emergency Department (HOSPITAL_COMMUNITY)
Admission: EM | Admit: 2023-01-01 | Discharge: 2023-01-01 | Disposition: A | Discharge status: Home / Self Care | Payer: Medicare Other | Attending: Emergency Medicine | Admitting: Emergency Medicine

## 2023-01-01 DIAGNOSIS — Z79899 Other long term (current) drug therapy: Secondary | ICD-10-CM | POA: Diagnosis not present

## 2023-01-01 DIAGNOSIS — Z87891 Personal history of nicotine dependence: Secondary | ICD-10-CM | POA: Diagnosis not present

## 2023-01-01 DIAGNOSIS — R600 Localized edema: Secondary | ICD-10-CM | POA: Diagnosis not present

## 2023-01-01 DIAGNOSIS — R0602 Shortness of breath: Secondary | ICD-10-CM | POA: Insufficient documentation

## 2023-01-01 DIAGNOSIS — Z79890 Hormone replacement therapy (postmenopausal): Secondary | ICD-10-CM | POA: Insufficient documentation

## 2023-01-01 DIAGNOSIS — I10 Essential (primary) hypertension: Secondary | ICD-10-CM | POA: Insufficient documentation

## 2023-01-01 DIAGNOSIS — Z8572 Personal history of non-Hodgkin lymphomas: Secondary | ICD-10-CM | POA: Diagnosis not present

## 2023-01-01 DIAGNOSIS — Z7982 Long term (current) use of aspirin: Secondary | ICD-10-CM | POA: Diagnosis not present

## 2023-01-01 DIAGNOSIS — E039 Hypothyroidism, unspecified: Secondary | ICD-10-CM | POA: Insufficient documentation

## 2023-01-01 LAB — CBC WITH DIFFERENTIAL/PLATELET
Abs Immature Granulocytes: 0.02 10*3/uL (ref 0.00–0.07)
Basophils Absolute: 0 10*3/uL (ref 0.0–0.1)
Basophils Relative: 1 %
Eosinophils Absolute: 0.1 10*3/uL (ref 0.0–0.5)
Eosinophils Relative: 1 %
HCT: 35.8 % — ABNORMAL LOW (ref 39.0–52.0)
Hemoglobin: 12.4 g/dL — ABNORMAL LOW (ref 13.0–17.0)
Immature Granulocytes: 0 %
Lymphocytes Relative: 11 %
Lymphs Abs: 0.9 10*3/uL (ref 0.7–4.0)
MCH: 32.3 pg (ref 26.0–34.0)
MCHC: 34.6 g/dL (ref 30.0–36.0)
MCV: 93.2 fL (ref 80.0–100.0)
Monocytes Absolute: 0.7 10*3/uL (ref 0.1–1.0)
Monocytes Relative: 8 %
Neutro Abs: 6.3 10*3/uL (ref 1.7–7.7)
Neutrophils Relative %: 79 %
Platelets: 198 10*3/uL (ref 150–400)
RBC: 3.84 MIL/uL — ABNORMAL LOW (ref 4.22–5.81)
RDW: 13.3 % (ref 11.5–15.5)
WBC: 8 10*3/uL (ref 4.0–10.5)
nRBC: 0 % (ref 0.0–0.2)

## 2023-01-01 LAB — COMPREHENSIVE METABOLIC PANEL
ALT: 40 U/L (ref 0–44)
AST: 42 U/L — ABNORMAL HIGH (ref 15–41)
Albumin: 4 g/dL (ref 3.5–5.0)
Alkaline Phosphatase: 111 U/L (ref 38–126)
Anion gap: 10 (ref 5–15)
BUN: 11 mg/dL (ref 6–20)
CO2: 26 mmol/L (ref 22–32)
Calcium: 8.5 mg/dL — ABNORMAL LOW (ref 8.9–10.3)
Chloride: 101 mmol/L (ref 98–111)
Creatinine, Ser: 0.69 mg/dL (ref 0.61–1.24)
GFR, Estimated: >60 mL/min (ref >60)
Glucose, Bld: 115 mg/dL — ABNORMAL HIGH (ref 70–99)
Potassium: 3.4 mmol/L — ABNORMAL LOW (ref 3.5–5.1)
Sodium: 137 mmol/L (ref 135–145)
Total Bilirubin: 1 mg/dL (ref 0.3–1.2)
Total Protein: 7.2 g/dL (ref 6.5–8.1)

## 2023-01-01 LAB — TROPONIN I (HIGH SENSITIVITY)
Troponin I (High Sensitivity): 8 ng/L (ref <18)
Troponin I (High Sensitivity): 7 ng/L (ref <18)

## 2023-01-01 LAB — BRAIN NATRIURETIC PEPTIDE: B Natriuretic Peptide: 82 pg/mL (ref 0.0–100.0)

## 2023-01-01 MED ORDER — ALBUTEROL SULFATE HFA 108 (90 BASE) MCG/ACT IN AERS
2.0000 | INHALATION_SPRAY | Freq: Four times a day (QID) | RESPIRATORY_TRACT | Status: DC
  Administered 2023-01-01: 2 via RESPIRATORY_TRACT
  Filled 2023-01-01: qty 6.7

## 2023-01-01 MED ORDER — IOHEXOL 350 MG/ML SOLN
75.0000 mL | Freq: Once | INTRAVENOUS | Status: AC | PRN
  Administered 2023-01-01: 75 mL via INTRAVENOUS

## 2023-01-01 NOTE — ED Provider Notes (Addendum)
[facility] EMERGENCY DEPARTMENT AT [facility] Provider Note   CSN: [id number] Arrival date & time: 01/01/23  4098     History  Chief Complaint  Patient presents with   Shortness of Breath    Brett Olson is a 57 y.o. male.  With a history of exertional shortness of breath.  Got much worse in the past 2 days.  Says some increased bilateral leg swelling.  Patient had heart attack back in 2015 has not really been seen by cardiologist since 2019.  Not clear whether patient has a stent or not.  But suspect he may.  Past medical history in for hypothyroidism traumatic brain injury non-Hodgkin's lymphoma history of chemotherapy hypertension hyperlipidemia.  Patient is a former smoker quit in 2016.  Patient has not had any chest pain.  Patient felt as if the shortness of breath got much worse at 5 this morning.  Patient's room air sats 91%.       Home Medications Prior to Admission medications   Medication Sig Start Date End Date Taking? Authorizing Provider  aspirin EC 81 MG tablet Take 81 mg by mouth daily.    [provider]  atorvastatin (LIPITOR) 40 MG tablet Take 40 mg by mouth at bedtime.     [provider]  carvedilol (COREG) 12.5 MG tablet Take 12.5 mg by mouth 2 (two) times daily with a meal.    [provider]  docusate sodium (COLACE) 100 MG capsule Take 1 capsule (100 mg total) by mouth 2 (two) times daily. 11/04/18   Lanney Gins, PA-C  ferrous sulfate (FERROUSUL) 325 (65 FE) MG tablet Take 1 tablet (325 mg total) by mouth 3 (three) times daily with meals for 14 days. 11/04/18 11/18/18  Lanney Gins, PA-C  fexofenadine (ALLEGRA) 180 MG tablet Take 180 mg by mouth daily.    [provider]  HYDROcodone-acetaminophen (NORCO) 7.5-325 MG tablet Take 1-2 tablets by mouth every 4 (four) hours as needed for moderate pain. 11/04/18   Lanney Gins, PA-C  levothyroxine (SYNTHROID, LEVOTHROID) 75 MCG tablet Take 75 mcg by mouth daily  before breakfast.    [provider]  lisinopril (PRINIVIL,ZESTRIL) 5 MG tablet Take 5 mg by mouth daily.    [provider]  loperamide (IMODIUM) 2 MG capsule Take 2 mg by mouth daily.    [provider]  losartan (COZAAR) 100 MG tablet Take 100 mg by mouth daily.    [provider]  meloxicam (MOBIC) 7.5 MG tablet Take 7.5 mg by mouth daily.    [provider]  meloxicam (MOBIC) 7.5 MG tablet Take 7.5 mg by mouth daily. 04/24/22   [provider]  methocarbamol (ROBAXIN) 500 MG tablet Take 1 tablet (500 mg total) by mouth every 6 (six) hours as needed for muscle spasms. 11/04/18   Lanney Gins, PA-C  montelukast (SINGULAIR) 10 MG tablet Take 10 mg by mouth daily. 11/30/15   [provider]  Multiple Vitamin (MULTIVITAMIN WITH MINERALS) TABS tablet Take 1 tablet by mouth daily.    [provider]  nitroGLYCERIN (NITROSTAT) 0.4 MG SL tablet Place 0.4 mg under the tongue every 5 (five) minutes as needed for chest pain.    [provider]  omeprazole (PRILOSEC) 40 MG capsule Take 40 mg by mouth daily.    [provider]  oxyCODONE-acetaminophen (PERCOCET/ROXICET) 5-325 MG tablet Take 1-2 tablets by mouth every 6 (six) hours as needed for severe pain. 12/17/18   Emi Holes, PA-C  polyethylene glycol (MIRALAX / GLYCOLAX) 17 g packet Take 17 g by mouth 2 (two) times daily. 11/04/18   Lanney Gins, PA-C  ticagrelor (BRILINTA) 60 MG TABS tablet Take 60 mg by mouth 2 (two) times daily.    [provider]  Turmeric 500 MG CAPS Take 1 capsule by mouth daily.     [provider]      Allergies    Ativan [lorazepam] and Morphine and codeine    Review of Systems   Review of Systems  Constitutional:  Negative for chills and fever.  HENT:  Negative for ear pain and sore throat.   Eyes:  Negative for pain and visual disturbance.  Respiratory:  Positive for shortness of breath. Negative for cough.    Cardiovascular:  Positive for leg swelling. Negative for chest pain and palpitations.  Gastrointestinal:  Negative for abdominal pain and vomiting.  Genitourinary:  Negative for dysuria and hematuria.  Musculoskeletal:  Negative for arthralgias and back pain.  Skin:  Negative for color change and rash.  Neurological:  Negative for seizures and syncope.  All other systems reviewed and are negative.   Physical Exam Updated Vital Signs BP (!) 182/97 Comment: RA  Pulse 93 Comment: RA  Temp 98.4 F (36.9 C) (Oral)   Resp 20 Comment: RA  Ht 1.88 m (6\' 2" )   Wt 113.4 kg   SpO2 92% Comment: RA  BMI 32.10 kg/m  Physical Exam Vitals and nursing note reviewed.  Constitutional:      General: He is not in acute distress.    Appearance: Normal appearance. He is well-developed.  HENT:     Head: Normocephalic and atraumatic.     Mouth/Throat:     Mouth: Mucous membranes are moist.  Eyes:     Extraocular Movements: Extraocular movements intact.     Conjunctiva/sclera: Conjunctivae normal.     Pupils: Pupils are equal, round, and reactive to light.  Cardiovascular:     Rate and Rhythm: Normal rate and regular rhythm.     Heart sounds: No murmur heard. Pulmonary:     Effort: Pulmonary effort is normal. No respiratory distress.     Breath sounds: Normal breath sounds. No wheezing.  Abdominal:     Palpations: Abdomen is soft.     Tenderness: There is no abdominal tenderness.  Musculoskeletal:        General: No swelling.     Cervical back: Normal range of motion and neck supple.     Right lower leg: Edema present.     Left lower leg: Edema present.  Skin:    General: Skin is warm and dry.     Capillary Refill: Capillary refill takes less than 2 seconds.  Neurological:     General: No focal deficit present.     Mental Status: He is alert and oriented to person, place, and time.  Psychiatric:        Mood and Affect: Mood normal.     ED Results / Procedures / Treatments    Labs (all labs ordered are listed, but only abnormal results are displayed) Labs Reviewed  CBC WITH DIFFERENTIAL/PLATELET - Abnormal; Notable for the following components:      Result Value   RBC 3.84 (*)    Hemoglobin 12.4 (*)    HCT 35.8 (*)    All other components within normal limits  COMPREHENSIVE METABOLIC PANEL - Abnormal; Notable for the following components:   Potassium 3.4 (*)    Glucose, Bld 115 (*)  Calcium 8.5 (*)    AST 42 (*)    All other components within normal limits  BRAIN NATRIURETIC PEPTIDE  TROPONIN I (HIGH SENSITIVITY)  TROPONIN I (HIGH SENSITIVITY)    EKG EKG Interpretation Date/Time:  Thursday January 01 2023 09:25:50 EDT Ventricular Rate:  100 PR Interval:  151 QRS Duration:  95 QT Interval:  363 QTC Calculation: 469 R Axis:   25  Text Interpretation: Sinus tachycardia No significant change since last tracing Confirmed by Vanetta Mulders [phone]) on 01/01/2023 10:01:54 AM  Radiology DG Chest Port 1 View  Result Date: 01/01/2023 CLINICAL DATA:  Shortness of breath EXAM: PORTABLE CHEST 1 VIEW COMPARISON:  None Available. FINDINGS: Underinflation. No consolidation, pneumothorax or effusion. No edema. Normal cardiopericardial silhouette. Overlapping cardiac leads. IMPRESSION: Underinflation.  No acute cardiopulmonary disease. Electronically Signed   By: Karen Kays M.D.   On: 01/01/2023 11:07    Procedures Procedures    Medications Ordered in ED Medications - No data to display  ED Course/ Medical Decision Making/ A&P                                 Medical Decision Making Amount and/or Complexity of Data Reviewed Labs: ordered. Radiology: ordered.  Risk Prescription drug management.   Patient's initial workup CBC white count 8.0 hemoglobin 12.4 platelets are good at 198.  Complete metabolic panel potassium down just a little bit at 3.4 renal function normal LFTs normal.  Patient's BNP is only 82 so making it highly unlikely that this  is pulmonary edema.  Initial troponin was good at 8.  Portable chest x-ray no acute cardiopulmonary disease findings.  An EKG without any acute findings.  But did have sinus tachycardia.  Based on this with a complaint of shortness of breath we will proceed to CT angio to rule out pulmonary embolus.  Since there is seems to be no evidence of pulmonary edema.  CT angio showed no evidence of blood clots.  Showed no pulmonary abnormalities.  Patient's lungs remained clear.  Patient's initial troponin was 7 repeat was 8.  Patient's BNP is not significantly elevated either.  With very reassuring.  But not sure why he is having dyspnea on exertion.  Patient's blood pressure is a little bit elevated here and follow-up with his primary care doctor would be important for that.  Continue current medications.   Final Clinical Impression(s) / ED Diagnoses Final diagnoses:  Exertional shortness of breath    Rx / DC Orders ED Discharge Orders     None         Vanetta Mulders, MD 01/01/23 1140    Vanetta Mulders, MD 01/01/23 [phone]

## 2023-01-01 NOTE — Discharge Instructions (Signed)
Physical appointment to follow-up with cardiology and pulmonary medicine.  Use your albuterol inhaler 2 puffs every 6 hours.  Return for any new or worse symptoms.  Workup here did not show any acute findings.

## 2023-01-01 NOTE — ED Triage Notes (Signed)
Pt comes in with SOB for the past couple of days. Pt had heart attack in 2015 and has not been to his cardiologist since 2019 after COVID started. Per family pt has experienced increased SOB past couple of days, and pt states increased SOB w/ exertion.

## 2023-01-08 ENCOUNTER — Encounter: Payer: Self-pay | Admitting: Internal Medicine

## 2023-01-08 ENCOUNTER — Ambulatory Visit: Payer: Medicare Other | Admitting: Internal Medicine

## 2023-01-08 VITALS — BP 174/101 | HR 85 | Ht 74.0 in | Wt 252.0 lb

## 2023-01-08 DIAGNOSIS — I10 Essential (primary) hypertension: Secondary | ICD-10-CM | POA: Diagnosis not present

## 2023-01-08 DIAGNOSIS — R0609 Other forms of dyspnea: Secondary | ICD-10-CM

## 2023-01-08 MED ORDER — VALSARTAN 160 MG PO TABS: ORAL_TABLET | ORAL | 11 refills | Status: DC
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
## 2023-01-08 NOTE — Patient Instructions (Addendum)
Increase carvedilol to 25mg  twice daily (2 x 12.5  in am and pm)  - if this works out you can call for new rx for the 25 mg pill   Omeprazole 40mg  Take 30-60 min before first meal of the day and add Pecpcid 20 mg after supper for 6 weeks until you see your heart doctor   Target for your blood pressure is 130 /80 or less    Pulmonary follow up is as needed

## 2023-01-09 ENCOUNTER — Encounter: Payer: Self-pay | Admitting: Internal Medicine

## 2023-01-09 ENCOUNTER — Telehealth: Payer: Self-pay | Admitting: Internal Medicine

## 2023-01-09 DIAGNOSIS — I10 Essential (primary) hypertension: Secondary | ICD-10-CM | POA: Insufficient documentation

## 2023-01-09 NOTE — Assessment & Plan Note (Signed)
Apparently did not do well on ACEi though pt does not remember why and he has instability of upper airway so would not be a good choice anyway   Clearly not adequately BB based on HR so rec double the coreg until sees cards and back to pulmonary prn    Discussed in detail all the  indications, usual  risks and alternatives  relative to the benefits with patient who agrees to proceed with Rx as outlined.      Each maintenance medication was reviewed in detail including emphasizing most importantly the difference between maintenance and prns and under what circumstances the prns are to be triggered using an action plan format where appropriate.  Total time for H and P, chart review, counseling,  directly observing portions of ambulatory 02 saturation study/ and generating customized AVS unique to this office visit / same day charting = 49 min new pt eval

## 2023-01-09 NOTE — Telephone Encounter (Signed)
Patient was here yesterday and saw Dr. Sherene Sires.  He has questions regarding his medications.  His pharmacy does not have the same medication listing as he received yesterday.  Please call patient at [phone]

## 2023-01-09 NOTE — Telephone Encounter (Signed)
Pt was called and discuss changes of medication patient, pt wasn't sure on which b/p he should be take . Per Dr. Sherene Sires  pt needs to [facility] .

## 2023-01-09 NOTE — Assessment & Plan Note (Addendum)
Onset 12/30/22 recurrent "episodic" doe  - spirometry 12/2015 FVC 4.2L (75%)FEV1 3.16L (73%)Normal ratio no BD responseTLC 86%   - 01/08/2023   Walked on RA  x  3  lap(s) =  approx 450  ft  @ rapid pace, stopped due to end of study  with lowest 02 sats 93%   W/u in ER 01/01/23 was nl including EKG and BNP and Cta  with only finding = elevated BP   Symptoms are  disproportionate to objective findings and not clear to what extent this is actually a pulmonary  problem but pt does appear to have difficult to sort out respiratory symptoms of unknown origin for which  DDX  = almost all start with A and  include Adherence, Ace Inhibitors, Acid Reflux, Active Sinus Disease, Alpha 1 Antitripsin deficiency, Anxiety masquerading as Airways dz,  ABPA,  Allergy(esp in young), Aspiration (esp in elderly), Adverse effects of meds,  Active smoking or Vaping, A bunch of PE's/clot burden (a few small clots can't cause this syndrome unless there is already severe underlying pulm or vascular dz with poor reserve),  Anemia or thyroid disorder, plus two Bs  = Bronchiectasis and Beta blocker use..and one C= CHF    Bolded dx's the most concerning here:  Adherence is always the initial "prime suspect" and is a multilayered concern that requires a "trust but verify" approach in every patient -  starting with knowing how to use medications, especially inhalers, correctly, keeping up with refills and understanding the fundamental difference between maintenance and prns vs those medications only taken for a very short course and then stopped and not refilled.  - return with all meds in hand using a trust but verify approach to confirm accurate Medication  Reconciliation The principal here is that until we are certain that the  patients are doing what we've asked, it makes no sense to ask them to do more.   ? Acid (or non-acid) GERD > would explain cough and assoc of loss of voice assoc with doe and  GERD always difficult to exclude  as up to 75% of pts in some series report no assoc GI/ Heartburn symptoms> rec max (24h)  acid suppression and diet restrictions/ reviewed and instructions given in writing.   ? Allergy/ asthma > no response to saba or noct events is strongly against but ok to continue singulair for now   ? Adverse drug effects :  For reasons that may related to vascular permability and nitric oxide pathways but not elevated  bradykinin levels (as seen with  ACEi use) losartan in the generic form has been reported now from mulitple sources  to cause a similar pattern of non-specific  upper airway symptoms as seen with acei.   This has not been reported with exposure to the other ARB's to date, so it may be necessary to  either use generic diovan or avapro if ARB needed or use an alternative class altogether.  See:  Dewayne Hatch Allergy Asthma Immunol  2008: 101: p [phone]    ? BB effects > doubt asthma so ok to use this in higher doses though  In the setting of respiratory symptoms of unknown etiology,  It  may be preferable to use   bisoprolol the most selective generic choice  on the market, at least on a trial basis, to make sure the spillover Beta 2 effects of the less specific Beta blockers are not contributing to this patient's symptoms.   ? Chf/ diastolic dysfunction  related to elevated BP despite reassuring BNP from ER   Next f/u is with cards and back here prn

## 2023-01-22 ENCOUNTER — Institutional Professional Consult (permissible substitution): Payer: Medicare Other | Admitting: Internal Medicine

## 2023-02-11 ENCOUNTER — Encounter: Payer: Self-pay | Admitting: Internal Medicine

## 2023-02-11 ENCOUNTER — Ambulatory Visit: Payer: Medicare Other | Attending: Internal Medicine | Admitting: Internal Medicine

## 2023-02-11 ENCOUNTER — Telehealth: Payer: Self-pay | Admitting: Internal Medicine

## 2023-02-11 VITALS — BP 164/96 | HR 84 | Ht 74.0 in | Wt 258.2 lb

## 2023-02-11 DIAGNOSIS — I10 Essential (primary) hypertension: Secondary | ICD-10-CM

## 2023-02-11 DIAGNOSIS — Z789 Other specified health status: Secondary | ICD-10-CM | POA: Diagnosis not present

## 2023-02-11 DIAGNOSIS — I2510 Atherosclerotic heart disease of native coronary artery without angina pectoris: Secondary | ICD-10-CM | POA: Diagnosis not present

## 2023-02-11 DIAGNOSIS — Z7902 Long term (current) use of antithrombotics/antiplatelets: Secondary | ICD-10-CM | POA: Diagnosis not present

## 2023-02-11 MED ORDER — NITROGLYCERIN 0.4 MG SL SUBL: 0.4000 mg | SUBLINGUAL_TABLET | SUBLINGUAL | 3 refills | Status: DC | PRN

## 2023-02-11 MED ORDER — AMLODIPINE BESYLATE 5 MG PO TABS: 5.0000 mg | ORAL_TABLET | Freq: Every day | ORAL | 2 refills | Status: DC

## 2023-02-11 MED ORDER — CARVEDILOL 12.5 MG PO TABS: 25.0000 mg | ORAL_TABLET | Freq: Two times a day (BID) | ORAL | 3 refills | Status: DC

## 2023-02-11 NOTE — Telephone Encounter (Signed)
Pt c/o medication issue:  1. Name of Medication:  carvedilol (COREG) 12.5 MG tablet  2. How are you currently taking this medication (dosage and times per day)?   3. Are you having a reaction (difficulty breathing--STAT)?   4. What is your medication issue?   Patient states when his medication was increased it was supposed to be for one 25 MG tablet twice daily instead of two 12.5 MG tablets twice daily. The prescription that was sent to his pharmacy was for two 12.5 MG tablets twice daily with quantity of only 60 tablets (15 days). Patient would like to have this corrected and resubmitted.

## 2023-02-11 NOTE — Patient Instructions (Signed)
Medication Instructions:  Your physician has recommended you make the following change in your medication:  Start taking Amlodipine 5 mg once daily Continue taking all other medications as prescribed  Labwork: None  Testing/Procedures: Your physician has requested that you have an echocardiogram. Echocardiography is a painless test that uses sound waves to create images of your heart. It provides your doctor with information about the size and shape of your heart and how well your heart's chambers and valves are working. This procedure takes approximately one hour. There are no restrictions for this procedure. Please do NOT wear cologne, perfume, aftershave, or lotions (deodorant is allowed). Please arrive 15 minutes prior to your appointment time.  Please note: We ask at that you not bring children with you during ultrasound (echo/ vascular) testing. Due to room size and safety concerns, children are not allowed in the ultrasound rooms during exams. Our front office staff cannot provide observation of children in our lobby area while testing is being conducted. An adult accompanying a patient to their appointment will only be allowed in the ultrasound room at the discretion of the ultrasound technician under special circumstances. We apologize for any inconvenience.   Follow-Up: Your physician recommends that you schedule a follow-up appointment in: 3 months  Any Other Special Instructions Will Be Listed Below (If Applicable). Thank you for choosing [facility]!      If you need a refill on your cardiac medications before your next appointment, please call your pharmacy.
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
## 2023-02-12 ENCOUNTER — Other Ambulatory Visit (HOSPITAL_COMMUNITY): Payer: Self-pay

## 2023-02-12 MED ORDER — CARVEDILOL 25 MG PO TABS
25.0000 mg | ORAL_TABLET | Freq: Two times a day (BID) | ORAL | 1 refills | Status: DC
  Filled 2023-02-12: qty 180, 90d supply, fill #0

## 2023-02-12 NOTE — Telephone Encounter (Signed)
Medication has been corrected

## 2023-02-20 ENCOUNTER — Other Ambulatory Visit (HOSPITAL_COMMUNITY): Payer: Self-pay

## 2023-02-20 ENCOUNTER — Telehealth: Payer: Self-pay | Admitting: Internal Medicine

## 2023-02-20 MED ORDER — CARVEDILOL 25 MG PO TABS: 25.0000 mg | ORAL_TABLET | Freq: Two times a day (BID) | ORAL | 1 refills | Status: DC

## 2023-02-20 NOTE — Telephone Encounter (Signed)
*  STAT* If patient is at the pharmacy, call can be transferred to refill team.   1. Which medications need to be refilled? (please list name of each medication and dose if known)   carvedilol (COREG) 25 MG tablet    4. Which pharmacy/location (including street and city if local pharmacy) is medication to be sent to? [facility] [phone] - [place], [place] - [address] AT [address]     5. Do they need a 30 day or 90 day supply? 90

## 2023-02-20 NOTE — Telephone Encounter (Signed)
Done

## 2023-02-23 ENCOUNTER — Ambulatory Visit: Payer: Medicare Other | Attending: Internal Medicine

## 2023-02-23 DIAGNOSIS — I10 Essential (primary) hypertension: Secondary | ICD-10-CM | POA: Diagnosis not present

## 2023-02-23 LAB — ECHOCARDIOGRAM COMPLETE
AR max vel: 1.66 cm2
AV Area VTI: 1.46 cm2
AV Area mean vel: 1.61 cm2
AV Mean grad: 7 mm[Hg]
AV Peak grad: 14.3 mm[Hg]
Ao pk vel: 1.89 m/s
Area-P 1/2: 3.89 cm2
Calc EF: 55 %
MV VTI: 1.88 cm2
S' Lateral: 3.9 cm
Single Plane A2C EF: 60.6 %
Single Plane A4C EF: 54.7 %

## 2023-05-11 ENCOUNTER — Emergency Department (HOSPITAL_COMMUNITY): Payer: Medicare Other

## 2023-05-11 ENCOUNTER — Encounter (HOSPITAL_COMMUNITY): Payer: Self-pay | Admitting: Emergency Medicine

## 2023-05-11 ENCOUNTER — Emergency Department (HOSPITAL_COMMUNITY)
Admission: EM | Admit: 2023-05-11 | Discharge: 2023-05-12 | Disposition: A | Discharge status: Against Medical Advice | Payer: Medicare Other | Attending: Emergency Medicine | Admitting: Emergency Medicine

## 2023-05-11 ENCOUNTER — Other Ambulatory Visit: Payer: Self-pay

## 2023-05-11 DIAGNOSIS — R0600 Dyspnea, unspecified: Secondary | ICD-10-CM | POA: Diagnosis present

## 2023-05-11 DIAGNOSIS — J101 Influenza due to other identified influenza virus with other respiratory manifestations: Secondary | ICD-10-CM | POA: Insufficient documentation

## 2023-05-11 DIAGNOSIS — R0902 Hypoxemia: Secondary | ICD-10-CM

## 2023-05-11 DIAGNOSIS — Z5329 Procedure and treatment not carried out because of patient's decision for other reasons: Secondary | ICD-10-CM | POA: Diagnosis not present

## 2023-05-11 LAB — COMPREHENSIVE METABOLIC PANEL
ALT: 118 U/L — ABNORMAL HIGH (ref 0–44)
AST: 127 U/L — ABNORMAL HIGH (ref 15–41)
Albumin: 4 g/dL (ref 3.5–5.0)
Alkaline Phosphatase: 94 U/L (ref 38–126)
Anion gap: 14 (ref 5–15)
BUN: 14 mg/dL (ref 6–20)
CO2: 23 mmol/L (ref 22–32)
Calcium: 8.6 mg/dL — ABNORMAL LOW (ref 8.9–10.3)
Chloride: 97 mmol/L — ABNORMAL LOW (ref 98–111)
Creatinine, Ser: 0.84 mg/dL (ref 0.61–1.24)
GFR, Estimated: >60 mL/min (ref >60)
Glucose, Bld: 122 mg/dL — ABNORMAL HIGH (ref 70–99)
Potassium: 4 mmol/L (ref 3.5–5.1)
Sodium: 134 mmol/L — ABNORMAL LOW (ref 135–145)
Total Bilirubin: 1.1 mg/dL (ref 0.0–1.2)
Total Protein: 7.6 g/dL (ref 6.5–8.1)

## 2023-05-11 LAB — RESP PANEL BY RT-PCR (RSV, FLU A&B, COVID)  RVPGX2
Influenza A by PCR: POSITIVE — AB
Influenza B by PCR: NEGATIVE
Resp Syncytial Virus by PCR: NEGATIVE
SARS Coronavirus 2 by RT PCR: NEGATIVE

## 2023-05-11 LAB — CBC
HCT: 38.8 % — ABNORMAL LOW (ref 39.0–52.0)
Hemoglobin: 13.2 g/dL (ref 13.0–17.0)
MCH: 31.1 pg (ref 26.0–34.0)
MCHC: 34 g/dL (ref 30.0–36.0)
MCV: 91.3 fL (ref 80.0–100.0)
Platelets: 206 10*3/uL (ref 150–400)
RBC: 4.25 MIL/uL (ref 4.22–5.81)
RDW: 13.6 % (ref 11.5–15.5)
WBC: 9.9 10*3/uL (ref 4.0–10.5)
nRBC: 0 % (ref 0.0–0.2)

## 2023-05-11 LAB — TROPONIN I (HIGH SENSITIVITY): Troponin I (High Sensitivity): 9 ng/L (ref <18)

## 2023-05-11 LAB — BRAIN NATRIURETIC PEPTIDE: B Natriuretic Peptide: 48 pg/mL (ref 0.0–100.0)

## 2023-05-11 NOTE — ED Triage Notes (Signed)
 Pt arrived to ED via POV c/o difficulty breathing and having to breath out of his mouth. Pt states this has been on going for the past month. Pt has been to cardiologist, pulmonary and UC in the last month for same.

## 2023-05-12 LAB — TROPONIN I (HIGH SENSITIVITY): Troponin I (High Sensitivity): 7 ng/L (ref <18)

## 2023-05-12 MED ORDER — ALBUTEROL SULFATE HFA 108 (90 BASE) MCG/ACT IN AERS
4.0000 | INHALATION_SPRAY | Freq: Once | RESPIRATORY_TRACT | Status: AC
  Administered 2023-05-12: 4 via RESPIRATORY_TRACT
  Filled 2023-05-12: qty 6.7

## 2023-05-12 MED ORDER — LACTATED RINGERS IV BOLUS
1000.0000 mL | Freq: Once | INTRAVENOUS | Status: AC
  Administered 2023-05-12: 1000 mL via INTRAVENOUS

## 2023-05-12 MED ORDER — ACETAMINOPHEN 500 MG PO TABS
1000.0000 mg | ORAL_TABLET | Freq: Once | ORAL | Status: AC
  Administered 2023-05-12: 1000 mg via ORAL
  Filled 2023-05-12: qty 2

## 2023-05-12 MED ORDER — ONDANSETRON HCL 4 MG/2ML IJ SOLN
4.0000 mg | Freq: Once | INTRAMUSCULAR | Status: AC
Start: 2023-05-12 — End: 2023-05-12
  Administered 2023-05-12: 4 mg via INTRAVENOUS
  Filled 2023-05-12: qty 2

## 2023-05-12 MED ORDER — AEROCHAMBER Z-STAT PLUS/MEDIUM MISC
1.0000 | Freq: Once | Status: AC
  Administered 2023-05-12: 1

## 2023-05-12 MED ORDER — PREDNISONE 20 MG PO TABS: ORAL_TABLET | ORAL | 0 refills | Status: DC

## 2023-05-12 MED ORDER — METHYLPREDNISOLONE SODIUM SUCC 125 MG IJ SOLR
125.0000 mg | Freq: Once | INTRAMUSCULAR | Status: AC
  Administered 2023-05-12: 125 mg via INTRAVENOUS
  Filled 2023-05-12: qty 2

## 2023-05-12 MED ORDER — IPRATROPIUM-ALBUTEROL 0.5-2.5 (3) MG/3ML IN SOLN
3.0000 mL | Freq: Once | RESPIRATORY_TRACT | Status: AC
  Administered 2023-05-12: 3 mL via RESPIRATORY_TRACT
  Filled 2023-05-12: qty 3

## 2023-05-12 MED ORDER — OSELTAMIVIR PHOSPHATE 75 MG PO CAPS: 75.0000 mg | ORAL_CAPSULE | Freq: Two times a day (BID) | ORAL | 0 refills | Status: DC

## 2023-05-12 MED ORDER — OSELTAMIVIR PHOSPHATE 75 MG PO CAPS
75.0000 mg | ORAL_CAPSULE | Freq: Once | ORAL | Status: AC
  Administered 2023-05-12: 75 mg via ORAL
  Filled 2023-05-12: qty 1

## 2023-05-12 NOTE — ED Notes (Signed)
Ambulated patient around the nurses station x2. Patient was tachy between 102-108. Patient sat was between 83%-88%. Patient stated he felt fine and is ready to go home.

## 2023-05-12 NOTE — ED Notes (Signed)
SpO2 dropped into upper 80s. Patient was placed on 2 liters via nasal cannula.

## 2023-05-13 ENCOUNTER — Other Ambulatory Visit: Payer: Self-pay

## 2023-05-13 ENCOUNTER — Emergency Department (HOSPITAL_COMMUNITY)
Admission: EM | Admit: 2023-05-13 | Discharge: 2023-05-13 | Disposition: A | Discharge status: Home / Self Care | Payer: Medicare Other | Attending: Emergency Medicine | Admitting: Emergency Medicine

## 2023-05-13 ENCOUNTER — Encounter (HOSPITAL_COMMUNITY): Payer: Self-pay

## 2023-05-13 DIAGNOSIS — Z79899 Other long term (current) drug therapy: Secondary | ICD-10-CM | POA: Insufficient documentation

## 2023-05-13 DIAGNOSIS — Z7982 Long term (current) use of aspirin: Secondary | ICD-10-CM | POA: Diagnosis not present

## 2023-05-13 DIAGNOSIS — R040 Epistaxis: Secondary | ICD-10-CM | POA: Diagnosis present

## 2023-05-13 DIAGNOSIS — I10 Essential (primary) hypertension: Secondary | ICD-10-CM | POA: Insufficient documentation

## 2023-05-13 DIAGNOSIS — I2510 Atherosclerotic heart disease of native coronary artery without angina pectoris: Secondary | ICD-10-CM | POA: Insufficient documentation

## 2023-05-13 LAB — CBC
HCT: 37.3 % — ABNORMAL LOW (ref 39.0–52.0)
Hemoglobin: 12.4 g/dL — ABNORMAL LOW (ref 13.0–17.0)
MCH: 30.5 pg (ref 26.0–34.0)
MCHC: 33.2 g/dL (ref 30.0–36.0)
MCV: 91.6 fL (ref 80.0–100.0)
Platelets: 226 10*3/uL (ref 150–400)
RBC: 4.07 MIL/uL — ABNORMAL LOW (ref 4.22–5.81)
RDW: 13.6 % (ref 11.5–15.5)
WBC: 11.7 10*3/uL — ABNORMAL HIGH (ref 4.0–10.5)
nRBC: 0 % (ref 0.0–0.2)

## 2023-05-13 MED ORDER — OXYMETAZOLINE HCL 0.05 % NA SOLN
1.0000 | Freq: Once | NASAL | Status: DC
  Filled 2023-05-13: qty 30

## 2023-05-13 NOTE — ED Notes (Signed)
Pt has productive cough, but denies any blood in his sputum. No active nose bleed at this time. Anticipate discharge soon.

## 2023-05-13 NOTE — ED Triage Notes (Signed)
Pt arrived via POV from home c/o epistaxis, since 1230 today. Pt reports he is anxious and recently tested Flu +.

## 2023-05-13 NOTE — ED Triage Notes (Signed)
Pt reports earlier today, he aggressively blew his nose and the bleeding began. Pt admits to leaving [facility] yesterday AMA.

## 2023-05-13 NOTE — Discharge Instructions (Signed)
Avoid blowing your nose for the next 2 to 3 days to avoid return of this nosebleed as it could disrupt the scab that is currently trying to heal the nosebleed site.  Take the Afrin home with you, if you do develop a new nosebleed squirt 1 to 2 puffs up the nostril and then gently squeeze your nose and tilt your head forward.  Hold very still for 10 minutes, if this does not stop your nosebleed after 10 minutes, return here for recheck.

## 2023-05-14 NOTE — ED Provider Notes (Signed)
 [place] EMERGENCY DEPARTMENT AT [facility] Provider Note   CSN: [id number] Arrival date & time: 05/13/23  1601     History  Chief Complaint  Patient presents with   Epistaxis    Brett Olson is a 58 y.o. male with a history including hypertension, CAD who is on Brilinta, history of ETO H use, GERD who was diagnosed here 2 days ago with influenza presenting for evaluation of nosebleed.  He states he blew his nose very vigorously around noon today and it bled copiously from the right nostril.  He applied pressure and tilted his head back but it continued to bleed, therefore presented here for evaluation.  While in triage he was given a temporary nasal packing and apparently the bleeding has resolved that this time.  He has no complaints at this time except for feeling weak with persistent cough and nasal congestion associated with his known influenza.  The history is provided by the patient and the spouse.       Home Medications Prior to Admission medications   Medication Sig Start Date End Date Taking? Authorizing Provider  amLODipine (NORVASC) 5 MG tablet Take 1 tablet (5 mg total) by mouth daily. 02/11/23   Mallipeddi, Vishnu P, MD  aspirin EC 81 MG tablet Take 81 mg by mouth daily.    [provider]  atorvastatin (LIPITOR) 40 MG tablet Take 40 mg by mouth at bedtime.     [provider]  carvedilol (COREG) 25 MG tablet Take 1 tablet (25 mg total) by mouth 2 (two) times daily with a meal. 02/20/23   Mallipeddi, Vishnu P, MD  cetirizine (ZYRTEC) 10 MG tablet Take 10 mg by mouth daily. 09/22/16   [provider]  HYDROcodone-acetaminophen (NORCO) 10-325 MG tablet Take 1 tablet by mouth at bedtime.    [provider]  levothyroxine (SYNTHROID) 112 MCG tablet Take 112 mcg by mouth daily. 10/15/22   [provider]  loperamide (IMODIUM) 2 MG capsule Take 2 mg by mouth in the morning and at bedtime.    [provider]   meloxicam (MOBIC) 7.5 MG tablet Take 7.5 mg by mouth daily.    [provider]  montelukast (SINGULAIR) 10 MG tablet Take 10 mg by mouth daily. 11/30/15   [provider]  Multiple Vitamin (MULTIVITAMIN ADULT PO) Take 1 tablet by mouth daily. 09/22/16   [provider]  nitroGLYCERIN (NITROSTAT) 0.4 MG SL tablet Place 1 tablet (0.4 mg total) under the tongue every 5 (five) minutes x 3 doses as needed for chest pain (if no relief after 2nd dose, proceed to ED or call 911). 02/11/23   Mallipeddi, Vishnu P, MD  omeprazole (PRILOSEC) 40 MG capsule Take 40 mg by mouth daily.    [provider]  oseltamivir (TAMIFLU) 75 MG capsule Take 1 capsule (75 mg total) by mouth every 12 (twelve) hours. 05/12/23   Mesner, Barbara Cower, MD  predniSONE (DELTASONE) 20 MG tablet 2 tabs po daily x 4 days 05/12/23   Mesner, Barbara Cower, MD  ticagrelor (BRILINTA) 60 MG TABS tablet Take 60 mg by mouth 2 (two) times daily.    [provider]  Turmeric 500 MG CAPS Take 1 capsule by mouth in the morning and at bedtime.    [provider]  valsartan (DIOVAN) 160 MG tablet Take 160 mg by mouth daily. 01/08/23   [provider]  Vitamin D, Ergocalciferol, (DRISDOL) 1.25 MG (50000 UNIT) CAPS capsule Take 50,000 Units by mouth  once a week. 09/23/22   [provider]      Allergies    Morphine, Lorazepam, and Morphine and codeine    Review of Systems   Review of Systems  Constitutional:  Positive for fatigue. Negative for fever.  HENT:  Positive for nosebleeds. Negative for congestion, sore throat and trouble swallowing.   Eyes: Negative.   Respiratory:  Positive for cough. Negative for chest tightness and shortness of breath.   Cardiovascular:  Negative for chest pain.  Gastrointestinal:  Negative for abdominal pain, nausea and vomiting.  Genitourinary: Negative.   Musculoskeletal:  Positive for myalgias. Negative for arthralgias, joint swelling and neck pain.  Skin:  Negative.  Negative for rash and wound.  Neurological:  Negative for dizziness, weakness, light-headedness, numbness and headaches.  Psychiatric/Behavioral:  The patient is nervous/anxious.   All other systems reviewed and are negative.   Physical Exam Updated Vital Signs BP (!) 148/92   Pulse (!) 113   Temp 99 F (37.2 C) (Oral)   Resp (!) 22   Ht 6\' 2"  (1.88 m)   Wt 117 kg   SpO2 95%   BMI 33.12 kg/m  Physical Exam Vitals and nursing note reviewed.  Constitutional:      Appearance: He is well-developed.  HENT:     Head: Normocephalic and atraumatic.     Nose:     Comments: Dried blood right nares.  No posterior pharyngeal bleeding.  No obvious anterior source of bleeding found.  Pt has a rightward septal deviation.  Eyes:     Conjunctiva/sclera: Conjunctivae normal.  Cardiovascular:     Rate and Rhythm: Regular rhythm. Tachycardia present.     Heart sounds: Normal heart sounds.     Comments: Borderline tachy on exam.  Pulmonary:     Effort: Pulmonary effort is normal.     Breath sounds: Normal breath sounds. No wheezing.  Musculoskeletal:        General: Normal range of motion.     Cervical back: Normal range of motion.  Skin:    General: Skin is warm and dry.  Neurological:     Mental Status: He is alert.     ED Results / Procedures / Treatments   Labs (all labs ordered are listed, but only abnormal results are displayed) Labs Reviewed  CBC - Abnormal; Notable for the following components:      Result Value   WBC 11.7 (*)    RBC 4.07 (*)    Hemoglobin 12.4 (*)    HCT 37.3 (*)    All other components within normal limits    EKG None  Radiology No results found.  Procedures Procedures    Medications Ordered in ED Medications  oxymetazoline (AFRIN) 0.05 % nasal spray 1 spray (has no administration in time range)    ED Course/ Medical Decision Making/ A&P                                 Medical Decision Making Patient presenting with right  sided epistaxis prior to arrival which has resolved during his wait time in the waiting room.  There is no active bleeding at this time.  He was given Afrin treatment and observed in the department and there was no evidence for rebleeding at this time.  He did have some mild tachycardia here, offered further evaluation, patient deferred, stating he knows he has the flu and he is only here  for his nosebleed.  He was reluctant to have any blood work completed, we did get a CBC which showed a relatively stable hemoglobin.  He was given home instructions on epistaxis treatment and when to return here if it returns and he is unable to get it stopped at home.  Advised to avoid rigorous blowing of his nose for the next couple of days.  Amount and/or Complexity of Data Reviewed Labs: ordered.    Details: CBC reviewed, he has a mild anemia at 12.4, not significantly changed from prior.  Risk OTC drugs.           Final Clinical Impression(s) / ED Diagnoses Final diagnoses:  Right-sided epistaxis    Rx / DC Orders ED Discharge Orders     None         Victoriano Lain 05/16/23 1114    Gloris Manchester, MD 05/17/23 [phone]

## 2023-05-18 ENCOUNTER — Other Ambulatory Visit: Payer: Self-pay

## 2023-05-18 ENCOUNTER — Emergency Department (HOSPITAL_COMMUNITY): Payer: Medicare Other

## 2023-05-18 ENCOUNTER — Inpatient Hospital Stay (HOSPITAL_COMMUNITY)
Admission: EM | Admit: 2023-05-18 | Discharge: 2023-05-21 | DRG: 871 | Disposition: A | Discharge status: Home / Self Care | Payer: Medicare Other | Attending: Family Medicine | Admitting: Family Medicine

## 2023-05-18 ENCOUNTER — Encounter (HOSPITAL_COMMUNITY): Payer: Self-pay

## 2023-05-18 ENCOUNTER — Ambulatory Visit
Admission: EM | Admit: 2023-05-18 | Discharge: 2023-05-18 | Disposition: A | Discharge status: Home / Self Care | Payer: Medicare Other

## 2023-05-18 DIAGNOSIS — I252 Old myocardial infarction: Secondary | ICD-10-CM

## 2023-05-18 DIAGNOSIS — I10 Essential (primary) hypertension: Secondary | ICD-10-CM | POA: Diagnosis present

## 2023-05-18 DIAGNOSIS — R000 Tachycardia, unspecified: Secondary | ICD-10-CM | POA: Diagnosis not present

## 2023-05-18 DIAGNOSIS — Z1152 Encounter for screening for COVID-19: Secondary | ICD-10-CM | POA: Diagnosis not present

## 2023-05-18 DIAGNOSIS — Z79899 Other long term (current) drug therapy: Secondary | ICD-10-CM

## 2023-05-18 DIAGNOSIS — Z7401 Bed confinement status: Secondary | ICD-10-CM

## 2023-05-18 DIAGNOSIS — M1990 Unspecified osteoarthritis, unspecified site: Secondary | ICD-10-CM | POA: Diagnosis present

## 2023-05-18 DIAGNOSIS — J1000 Influenza due to other identified influenza virus with unspecified type of pneumonia: Secondary | ICD-10-CM | POA: Diagnosis present

## 2023-05-18 DIAGNOSIS — Z791 Long term (current) use of non-steroidal anti-inflammatories (NSAID): Secondary | ICD-10-CM

## 2023-05-18 DIAGNOSIS — Z885 Allergy status to narcotic agent status: Secondary | ICD-10-CM

## 2023-05-18 DIAGNOSIS — R7989 Other specified abnormal findings of blood chemistry: Secondary | ICD-10-CM | POA: Diagnosis not present

## 2023-05-18 DIAGNOSIS — J189 Pneumonia, unspecified organism: Secondary | ICD-10-CM

## 2023-05-18 DIAGNOSIS — Z9861 Coronary angioplasty status: Secondary | ICD-10-CM

## 2023-05-18 DIAGNOSIS — J101 Influenza due to other identified influenza virus with other respiratory manifestations: Secondary | ICD-10-CM

## 2023-05-18 DIAGNOSIS — I700 Atherosclerosis of aorta: Secondary | ICD-10-CM | POA: Diagnosis present

## 2023-05-18 DIAGNOSIS — Z923 Personal history of irradiation: Secondary | ICD-10-CM | POA: Diagnosis not present

## 2023-05-18 DIAGNOSIS — E785 Hyperlipidemia, unspecified: Secondary | ICD-10-CM | POA: Diagnosis present

## 2023-05-18 DIAGNOSIS — Z79890 Hormone replacement therapy (postmenopausal): Secondary | ICD-10-CM

## 2023-05-18 DIAGNOSIS — Z7982 Long term (current) use of aspirin: Secondary | ICD-10-CM | POA: Diagnosis not present

## 2023-05-18 DIAGNOSIS — I2510 Atherosclerotic heart disease of native coronary artery without angina pectoris: Secondary | ICD-10-CM | POA: Insufficient documentation

## 2023-05-18 DIAGNOSIS — T380X5A Adverse effect of glucocorticoids and synthetic analogues, initial encounter: Secondary | ICD-10-CM | POA: Diagnosis present

## 2023-05-18 DIAGNOSIS — R040 Epistaxis: Secondary | ICD-10-CM | POA: Diagnosis present

## 2023-05-18 DIAGNOSIS — F419 Anxiety disorder, unspecified: Secondary | ICD-10-CM | POA: Diagnosis present

## 2023-05-18 DIAGNOSIS — K219 Gastro-esophageal reflux disease without esophagitis: Secondary | ICD-10-CM | POA: Insufficient documentation

## 2023-05-18 DIAGNOSIS — J9621 Acute and chronic respiratory failure with hypoxia: Secondary | ICD-10-CM | POA: Diagnosis present

## 2023-05-18 DIAGNOSIS — Z87820 Personal history of traumatic brain injury: Secondary | ICD-10-CM

## 2023-05-18 DIAGNOSIS — Z9221 Personal history of antineoplastic chemotherapy: Secondary | ICD-10-CM

## 2023-05-18 DIAGNOSIS — Z87891 Personal history of nicotine dependence: Secondary | ICD-10-CM

## 2023-05-18 DIAGNOSIS — D649 Anemia, unspecified: Secondary | ICD-10-CM | POA: Diagnosis present

## 2023-05-18 DIAGNOSIS — E039 Hypothyroidism, unspecified: Secondary | ICD-10-CM | POA: Diagnosis present

## 2023-05-18 DIAGNOSIS — E8801 Alpha-1-antitrypsin deficiency: Secondary | ICD-10-CM | POA: Diagnosis present

## 2023-05-18 DIAGNOSIS — C859A Non-Hodgkin lymphoma, unspecified, in remission: Secondary | ICD-10-CM | POA: Diagnosis present

## 2023-05-18 DIAGNOSIS — J9601 Acute respiratory failure with hypoxia: Secondary | ICD-10-CM

## 2023-05-18 DIAGNOSIS — A419 Sepsis, unspecified organism: Secondary | ICD-10-CM | POA: Diagnosis present

## 2023-05-18 DIAGNOSIS — Z96653 Presence of artificial knee joint, bilateral: Secondary | ICD-10-CM | POA: Diagnosis present

## 2023-05-18 DIAGNOSIS — Z7902 Long term (current) use of antithrombotics/antiplatelets: Secondary | ICD-10-CM

## 2023-05-18 DIAGNOSIS — A4189 Other specified sepsis: Secondary | ICD-10-CM | POA: Diagnosis present

## 2023-05-18 DIAGNOSIS — Z860100 Personal history of colon polyps, unspecified: Secondary | ICD-10-CM

## 2023-05-18 DIAGNOSIS — F40240 Claustrophobia: Secondary | ICD-10-CM | POA: Diagnosis present

## 2023-05-18 DIAGNOSIS — Z888 Allergy status to other drugs, medicaments and biological substances status: Secondary | ICD-10-CM

## 2023-05-18 DIAGNOSIS — R7303 Prediabetes: Secondary | ICD-10-CM | POA: Diagnosis present

## 2023-05-18 LAB — CBC WITH DIFFERENTIAL/PLATELET
Abs Immature Granulocytes: 0.21 10*3/uL — ABNORMAL HIGH (ref 0.00–0.07)
Basophils Absolute: 0 10*3/uL (ref 0.0–0.1)
Basophils Relative: 0 %
Eosinophils Absolute: 0.1 10*3/uL (ref 0.0–0.5)
Eosinophils Relative: 0 %
HCT: 34.8 % — ABNORMAL LOW (ref 39.0–52.0)
Hemoglobin: 11.6 g/dL — ABNORMAL LOW (ref 13.0–17.0)
Immature Granulocytes: 2 %
Lymphocytes Relative: 10 %
Lymphs Abs: 1.4 10*3/uL (ref 0.7–4.0)
MCH: 30.5 pg (ref 26.0–34.0)
MCHC: 33.3 g/dL (ref 30.0–36.0)
MCV: 91.6 fL (ref 80.0–100.0)
Monocytes Absolute: 1.4 10*3/uL — ABNORMAL HIGH (ref 0.1–1.0)
Monocytes Relative: 10 %
Neutro Abs: 10.9 10*3/uL — ABNORMAL HIGH (ref 1.7–7.7)
Neutrophils Relative %: 78 %
Platelets: 370 10*3/uL (ref 150–400)
RBC: 3.8 MIL/uL — ABNORMAL LOW (ref 4.22–5.81)
RDW: 13.6 % (ref 11.5–15.5)
WBC: 14 10*3/uL — ABNORMAL HIGH (ref 4.0–10.5)
nRBC: 0 % (ref 0.0–0.2)

## 2023-05-18 LAB — RESP PANEL BY RT-PCR (RSV, FLU A&B, COVID)  RVPGX2
Influenza A by PCR: POSITIVE — AB
Influenza B by PCR: NEGATIVE
Resp Syncytial Virus by PCR: NEGATIVE
SARS Coronavirus 2 by RT PCR: NEGATIVE

## 2023-05-18 LAB — COMPREHENSIVE METABOLIC PANEL
ALT: 84 U/L — ABNORMAL HIGH (ref 0–44)
AST: 45 U/L — ABNORMAL HIGH (ref 15–41)
Albumin: 3.1 g/dL — ABNORMAL LOW (ref 3.5–5.0)
Alkaline Phosphatase: 130 U/L — ABNORMAL HIGH (ref 38–126)
Anion gap: 13 (ref 5–15)
BUN: 15 mg/dL (ref 6–20)
CO2: 25 mmol/L (ref 22–32)
Calcium: 8.9 mg/dL (ref 8.9–10.3)
Chloride: 98 mmol/L (ref 98–111)
Creatinine, Ser: 0.77 mg/dL (ref 0.61–1.24)
GFR, Estimated: >60 mL/min (ref >60)
Glucose, Bld: 118 mg/dL — ABNORMAL HIGH (ref 70–99)
Potassium: 3.7 mmol/L (ref 3.5–5.1)
Sodium: 136 mmol/L (ref 135–145)
Total Bilirubin: 1 mg/dL (ref 0.0–1.2)
Total Protein: 7.9 g/dL (ref 6.5–8.1)

## 2023-05-18 LAB — TROPONIN I (HIGH SENSITIVITY)
Troponin I (High Sensitivity): 6 ng/L (ref <18)
Troponin I (High Sensitivity): 8 ng/L (ref <18)

## 2023-05-18 LAB — BRAIN NATRIURETIC PEPTIDE: B Natriuretic Peptide: 115 pg/mL — ABNORMAL HIGH (ref 0.0–100.0)

## 2023-05-18 MED ORDER — TRAZODONE HCL 50 MG PO TABS: 25.0000 mg | ORAL_TABLET | Freq: Every evening | ORAL | Status: DC | PRN

## 2023-05-18 MED ORDER — ONDANSETRON HCL 4 MG PO TABS: 4.0000 mg | ORAL_TABLET | Freq: Four times a day (QID) | ORAL | Status: DC | PRN

## 2023-05-18 MED ORDER — IPRATROPIUM-ALBUTEROL 0.5-2.5 (3) MG/3ML IN SOLN
3.0000 mL | Freq: Once | RESPIRATORY_TRACT | Status: AC
Start: 2023-05-18 — End: 2023-05-18
  Administered 2023-05-18: 3 mL via RESPIRATORY_TRACT
  Filled 2023-05-18: qty 3

## 2023-05-18 MED ORDER — IOHEXOL 350 MG/ML SOLN
75.0000 mL | Freq: Once | INTRAVENOUS | Status: AC | PRN
  Administered 2023-05-18: 75 mL via INTRAVENOUS

## 2023-05-18 MED ORDER — IPRATROPIUM-ALBUTEROL 0.5-2.5 (3) MG/3ML IN SOLN
3.0000 mL | Freq: Four times a day (QID) | RESPIRATORY_TRACT | Status: DC
  Administered 2023-05-18 – 2023-05-20 (×6): 3 mL via RESPIRATORY_TRACT
  Filled 2023-05-18 (×6): qty 3

## 2023-05-18 MED ORDER — GUAIFENESIN ER 600 MG PO TB12
600.0000 mg | ORAL_TABLET | Freq: Two times a day (BID) | ORAL | Status: DC
  Administered 2023-05-18 – 2023-05-21 (×6): 600 mg via ORAL
  Filled 2023-05-18 (×5): qty 1

## 2023-05-18 MED ORDER — LACTATED RINGERS IV SOLN
150.0000 mL/h | INTRAVENOUS | Status: DC
Start: 2023-05-18 — End: 2023-05-19
  Administered 2023-05-18 – 2023-05-19 (×2): 150 mL/h via INTRAVENOUS

## 2023-05-18 MED ORDER — MAGNESIUM HYDROXIDE 400 MG/5ML PO SUSP: 30.0000 mL | Freq: Every day | ORAL | Status: DC | PRN

## 2023-05-18 MED ORDER — SODIUM CHLORIDE 0.9 % IV SOLN: 500.0000 mg | Freq: Once | INTRAVENOUS | Status: DC

## 2023-05-18 MED ORDER — METHYLPREDNISOLONE SODIUM SUCC 125 MG IJ SOLR
125.0000 mg | Freq: Once | INTRAMUSCULAR | Status: AC
  Administered 2023-05-18: 125 mg via INTRAVENOUS
  Filled 2023-05-18: qty 2

## 2023-05-18 MED ORDER — ACETAMINOPHEN 325 MG PO TABS: 650.0000 mg | ORAL_TABLET | Freq: Four times a day (QID) | ORAL | Status: DC | PRN

## 2023-05-18 MED ORDER — SODIUM CHLORIDE 0.9 % IV SOLN
500.0000 mg | INTRAVENOUS | Status: DC
  Administered 2023-05-18 – 2023-05-20 (×3): 500 mg via INTRAVENOUS
  Filled 2023-05-18 (×3): qty 5

## 2023-05-18 MED ORDER — SODIUM CHLORIDE 0.9 % IV SOLN
2.0000 g | INTRAVENOUS | Status: DC
  Administered 2023-05-18 – 2023-05-20 (×3): 2 g via INTRAVENOUS
  Filled 2023-05-18 (×3): qty 20

## 2023-05-18 MED ORDER — ENOXAPARIN SODIUM 60 MG/0.6ML IJ SOSY
55.0000 mg | PREFILLED_SYRINGE | INTRAMUSCULAR | Status: DC
  Administered 2023-05-18 – 2023-05-19 (×2): 55 mg via SUBCUTANEOUS
  Filled 2023-05-18 (×3): qty 0.6

## 2023-05-18 MED ORDER — SODIUM CHLORIDE 0.9 % IV SOLN: 1.0000 g | Freq: Once | INTRAVENOUS | Status: DC

## 2023-05-18 MED ORDER — ACETAMINOPHEN 650 MG RE SUPP: 650.0000 mg | Freq: Four times a day (QID) | RECTAL | Status: DC | PRN

## 2023-05-18 MED ORDER — HYDROCOD POLI-CHLORPHE POLI ER 10-8 MG/5ML PO SUER
5.0000 mL | Freq: Two times a day (BID) | ORAL | Status: DC | PRN
  Administered 2023-05-18 – 2023-05-21 (×4): 5 mL via ORAL
  Filled 2023-05-18 (×4): qty 5

## 2023-05-18 MED ORDER — ONDANSETRON HCL 4 MG/2ML IJ SOLN: 4.0000 mg | Freq: Four times a day (QID) | INTRAMUSCULAR | Status: DC | PRN

## 2023-05-18 NOTE — ED Provider Notes (Signed)
 [place] [facility] DEPARTMENT AT [facility] Provider Note   CSN: [id number] Arrival date & time: 05/18/23  1604     History  Chief Complaint  Patient presents with   Shortness of Breath    Brett Olson is a 58 y.o. male history of CAD, MI, A1 antitrypsin deficiency, non-Hodgkin's lymphoma presented for shortness of breath.  Patient states he is chronically short of breath but went to urgent care today due to tachycardia and was referred here due to hypoxia.  Patient does not normally wear oxygen but presented on 3 L nasal cannula.  Patient was also tachycardic when I evaluated him and triage.  Patient denies history of blood clots, productive cough, fevers.  Patient has had some leg swelling.    Home Medications Prior to Admission medications   Medication Sig Start Date End Date Taking? Authorizing Provider  amoxicillin (AMOXIL) 875 MG tablet Take 875 mg by mouth 2 (two) times daily. 05/03/23  Yes [provider]  amLODipine (NORVASC) 5 MG tablet Take 1 tablet (5 mg total) by mouth daily. 02/11/23   Mallipeddi, Vishnu P, MD  aspirin EC 81 MG tablet Take 81 mg by mouth daily.    [provider]  atorvastatin (LIPITOR) 40 MG tablet Take 40 mg by mouth at bedtime.     [provider]  carvedilol (COREG) 25 MG tablet Take 1 tablet (25 mg total) by mouth 2 (two) times daily with a meal. 02/20/23   Mallipeddi, Vishnu P, MD  cetirizine (ZYRTEC) 10 MG tablet Take 10 mg by mouth daily. 09/22/16   [provider]  HYDROcodone-acetaminophen (NORCO) 10-325 MG tablet Take 1 tablet by mouth at bedtime.    [provider]  levothyroxine (SYNTHROID) 112 MCG tablet Take 112 mcg by mouth daily. 10/15/22   [provider]  lidocaine (XYLOCAINE) 2 % solution Use as directed 15 mLs in the mouth or throat 3 (three) times daily as needed for mouth pain. 05/03/23   [provider]  loperamide (IMODIUM) 2 MG capsule Take 2 mg by mouth in  the morning and at bedtime.    [provider]  meloxicam (MOBIC) 7.5 MG tablet Take 7.5 mg by mouth daily.    [provider]  montelukast (SINGULAIR) 10 MG tablet Take 10 mg by mouth daily. 11/30/15   [provider]  Multiple Vitamin (MULTIVITAMIN ADULT PO) Take 1 tablet by mouth daily. 09/22/16   [provider]  nitroGLYCERIN (NITROSTAT) 0.4 MG SL tablet Place 1 tablet (0.4 mg total) under the tongue every 5 (five) minutes x 3 doses as needed for chest pain (if no relief after 2nd dose, proceed to ED or call 911). 02/11/23   Mallipeddi, Vishnu P, MD  omeprazole (PRILOSEC) 40 MG capsule Take 40 mg by mouth daily.    [provider]  oseltamivir (TAMIFLU) 75 MG capsule Take 1 capsule (75 mg total) by mouth every 12 (twelve) hours. 05/12/23   Mesner, Barbara Cower, MD  predniSONE (DELTASONE) 20 MG tablet 2 tabs po daily x 4 days 05/12/23   Mesner, Barbara Cower, MD  ticagrelor (BRILINTA) 60 MG TABS tablet Take 60 mg by mouth 2 (two) times daily.    [provider]  Turmeric 500 MG CAPS Take 1 capsule by mouth in the morning and at bedtime.    [provider]  valsartan (DIOVAN) 160 MG tablet Take 160 mg by mouth daily. 01/08/23   [provider]  Vitamin D, Ergocalciferol, (DRISDOL) 1.25 MG (  50000 UNIT) CAPS capsule Take 50,000 Units by mouth once a week. 09/23/22   [provider]      Allergies    Morphine, Lorazepam, and Morphine and codeine    Review of Systems   Review of Systems  Respiratory:  Positive for shortness of breath.     Physical Exam Updated Vital Signs BP (!) 155/80   Pulse (!) 108   Temp 99.3 F (37.4 C) (Axillary)   Resp 18   Ht 6\' 2"  (1.88 m)   Wt 117 kg   SpO2 95%   BMI 33.12 kg/m  Physical Exam Constitutional:      General: He is not in acute distress. Cardiovascular:     Rate and Rhythm: Normal rate and regular rhythm.     Pulses: Normal pulses.     Heart sounds: Normal heart sounds.   Pulmonary:     Effort: Pulmonary effort is normal. No respiratory distress.     Breath sounds: Normal breath sounds.     Comments: Able to speak in full sentences On 3 L nasal cannula which is at from baseline Decreased breath sounds bilaterally however no other adventitious lung sounds noted Nonproductive cough Musculoskeletal:     Comments: 1+ bilateral pitting edema  Skin:    General: Skin is warm and dry.  Neurological:     Mental Status: He is alert.  Psychiatric:        Mood and Affect: Mood normal.     ED Results / Procedures / Treatments   Labs (all labs ordered are listed, but only abnormal results are displayed) Labs Reviewed  RESP PANEL BY RT-PCR (RSV, FLU A&B, COVID)  RVPGX2 - Abnormal; Notable for the following components:      Result Value   Influenza A by PCR POSITIVE (*)    All other components within normal limits  COMPREHENSIVE METABOLIC PANEL - Abnormal; Notable for the following components:   Glucose, Bld 118 (*)    Albumin 3.1 (*)    AST 45 (*)    ALT 84 (*)    Alkaline Phosphatase 130 (*)    All other components within normal limits  CBC WITH DIFFERENTIAL/PLATELET - Abnormal; Notable for the following components:   WBC 14.0 (*)    RBC 3.80 (*)    Hemoglobin 11.6 (*)    HCT 34.8 (*)    Neutro Abs 10.9 (*)    Monocytes Absolute 1.4 (*)    Abs Immature Granulocytes 0.21 (*)    All other components within normal limits  BRAIN NATRIURETIC PEPTIDE - Abnormal; Notable for the following components:   B Natriuretic Peptide 115.0 (*)    All other components within normal limits  CULTURE, BLOOD (ROUTINE X 2)  CULTURE, BLOOD (ROUTINE X 2)  TROPONIN I (HIGH SENSITIVITY)  TROPONIN I (HIGH SENSITIVITY)    EKG None  Radiology CT Angio Chest PE W/Cm &/Or Wo Cm Result Date: 05/18/2023 CLINICAL DATA:  Tachycardia, hypoxia. EXAM: CT ANGIOGRAPHY CHEST WITH CONTRAST TECHNIQUE: Multidetector CT imaging of the chest was performed using the standard protocol  during bolus administration of intravenous contrast. Multiplanar CT image reconstructions and MIPs were obtained to evaluate the vascular anatomy. RADIATION DOSE REDUCTION: This exam was performed according to the departmental dose-optimization program which includes automated exposure control, adjustment of the mA and/or kV according to patient size and/or use of iterative reconstruction technique. CONTRAST:  75mL OMNIPAQUE IOHEXOL 350 MG/ML SOLN COMPARISON:  01/01/2023 FINDINGS: Cardiovascular: No filling defects in the pulmonary arteries  to suggest pulmonary emboli. Heart is normal size. Aorta is normal caliber. Three vessel coronary artery calcifications. Scattered aortic calcifications. Mediastinum/Nodes: No mediastinal, hilar, or axillary adenopathy. Trachea and esophagus are unremarkable. Thyroid unremarkable. Lungs/Pleura: Patchy bilateral ground-glass airspace opacities in the right upper lobe, right middle lobe and both lower lobes concerning for multifocal pneumonia. No effusions or pneumothorax. Upper Abdomen: No acute findings Musculoskeletal: Chest wall soft tissues are unremarkable. No acute bony abnormality. Review of the MIP images confirms the above findings. IMPRESSION: No evidence of pulmonary embolus. Patchy bilateral ground-glass airspace opacities, right greater than left concerning for multifocal pneumonia. Three-vessel coronary artery disease. Aortic Atherosclerosis (ICD10-I70.0). Electronically Signed   By: Charlett Nose M.D.   On: 05/18/2023 20:16   DG Chest Port 1 View Result Date: 05/18/2023 CLINICAL DATA:  Tachycardia and hypoxia. History of non-Hodgkin's lymphoma. EXAM: PORTABLE CHEST 1 VIEW COMPARISON:  05/11/2023 FINDINGS: Patient rotated to the right. Midline trachea. Mild cardiomegaly. Possible small left pleural effusion. No pneumothorax. Pulmonary interstitial coarsening is new since 05/11/2023, accentuated by AP portable technique. Mild left base atelectasis and/or scarring is  not significantly changed. IMPRESSION: Lower lung predominant interstitial coarsening is new since 05/11/2023. Suspect mild pulmonary venous congestion. CTA is pending. Possible small left pleural effusion. Electronically Signed   By: Jeronimo Greaves M.D.   On: 05/18/2023 19:24    Procedures .Critical Care  Performed by: Netta Corrigan, PA-C Authorized by: Netta Corrigan, PA-C   Critical care provider statement:    Critical care time (minutes):  40   Critical care time was exclusive of:  Separately billable procedures and treating other patients   Critical care was necessary to treat or prevent imminent or life-threatening deterioration of the following conditions:  Respiratory failure   Critical care was time spent personally by me on the following activities:  Blood draw for specimens, development of treatment plan with patient or surrogate, discussions with consultants, evaluation of patient's response to treatment, examination of patient, obtaining history from patient or surrogate, review of old charts, re-evaluation of patient's condition, pulse oximetry, ordering and review of radiographic studies, ordering and review of laboratory studies and ordering and performing treatments and interventions   I assumed direction of critical care for this patient from another provider in my specialty: no     Care discussed with: admitting provider       Medications Ordered in ED Medications  cefTRIAXone (ROCEPHIN) 1 g in sodium chloride 0.9 % 100 mL IVPB (has no administration in time range)  azithromycin (ZITHROMAX) 500 mg in sodium chloride 0.9 % 250 mL IVPB (has no administration in time range)  ipratropium-albuterol (DUONEB) 0.5-2.5 (3) MG/3ML nebulizer solution 3 mL (3 mLs Nebulization Given 05/18/23 1843)  methylPREDNISolone sodium succinate (SOLU-MEDROL) 125 mg/2 mL injection 125 mg (125 mg Intravenous Given 05/18/23 1822)  iohexol (OMNIPAQUE) 350 MG/ML injection 75 mL (75 mLs Intravenous  Contrast Given 05/18/23 1832)    ED Course/ Medical Decision Making/ A&P                                 Medical Decision Making Amount and/or Complexity of Data Reviewed Labs: ordered. Radiology: ordered.  Risk Prescription drug management. Decision regarding hospitalization.   Brett Olson 58 y.o. presented today for shortness of breath.  Working DDx that I considered at this time includes, but not limited to, asthma/COPD exacerbation, URI, viral illness, anemia, ACS, PE, pneumonia, pleural effusion,  lung cancer, CHF, respiratory distress, medication side effect, intoxication.  R/o DDx: Asthma exacerbation, anemia, ACS, PE, pleural effusion, lung cancer, CHF, respiratory distress, medication side effect, intoxication: These are considered less likely due to history of present illness, physical exam, labs/imaging findings  Review of prior external notes: 05/13/2023 ED  Unique Tests and My Independent Interpretation:  CBC: Leukocytosis 14 with left shift CMP: Unremarkable EKG: Sinus tachycardia, no signs of ST elevation depressions noted Troponin: 6, 8 CXR: Pulmonary congestion CTA Chest PE: Bilateral multifocal pneumonia Respiratory Panel: Influenza A positive  Social Determinants of Health: none  Discussion with Independent Historian:  Wife  Discussion of Management of Tests:  Mansy, MD Hospitalist  Risk: High: hospitalization or escalation of hospital-level care  Risk Stratification Score: None  Plan: On exam patient was no acute distress but was tachycardic and on nasal cannula which is up from baseline. Physical exam showed decreased breath sounds bilaterally with mild leg swelling. The cardiac monitor was ordered secondary to the patient's history of shortness of breath and to monitor the patient for dysrhythmia. Cardiac monitor by my independent interpretation showed normal sinus.  Will give patient steroids along with DuoNeb as he does have history of A1 AT.  Will  obtain broad workup and scanning for PE as he is tachycardic here and hypoxic.  Do plan on admission due to hypoxia.  On reevaluation of patient patient was doing better after the breathing treatment and steroids.  Cardiac monitor by my independent or potation does show normal sinus however patient is tachycardic at 106 in the room.  Patient does test positive for influenza A which I suspect is exacerbating his A1 AT.  Still waiting on imaging but still plan on admission.  CTA shows signs of pneumonia.  Will start patient on IV antibiotics and admit to hospitalist.  Patient was accepted for admission.  This chart was dictated using voice recognition software.  Despite best efforts to proofread,  errors can occur which can change the documentation meaning.         Final Clinical Impression(s) / ED Diagnoses Final diagnoses:  Acute on chronic respiratory failure with hypoxia (HCC)  Influenza A  Pneumonia of both lungs due to infectious organism, unspecified part of lung    Rx / DC Orders ED Discharge Orders     None         Remi Deter 05/18/23 2043    Bethann Berkshire, MD 05/20/23 [phone]

## 2023-05-18 NOTE — ED Notes (Signed)
 Pt reports his biological father is positive for Alpha-1 antitrypsin deficiency and COPD. Pt concerned he may inherited these diseases as well which could lead to an exacerbation of his current symptoms.

## 2023-05-18 NOTE — ED Notes (Signed)
 Pt placed on 3L Nasal Cannula and O2 Sats improved to 94%. Pt placed nasal cannula in his mouth to avoid further nasal irritation.

## 2023-05-18 NOTE — ED Triage Notes (Signed)
 Pt arrived via POV from urgent care for concerns of tachycardia and hypoxia at their facility. Pt recommended to follow up in the ER for further evaluation. Pt endorses frequent episodes of epistaxis.

## 2023-05-18 NOTE — ED Triage Notes (Signed)
 Per pt has been experiencing post nasal drip, last few weeks, tested positive for the flu having SOB, difficulty, nose bleeds wife states he has chronic post nasal drip, and has had viral pneumonia with the flu.

## 2023-05-18 NOTE — ED Notes (Signed)
 Pt to CT

## 2023-05-18 NOTE — ED Notes (Signed)
 Patient is being discharged from the Urgent Care and sent to the Emergency Department via PMV . Per provider Faye Ramsay., patient is in need of higher level of care due to SOB with low saturation needing further work up. Patient is aware and verbalizes understanding of plan of care.  Vitals:   05/18/23 1540  BP: 136/80  Pulse: (!) 102  Resp: (!) 24  Temp: 98.1 F (36.7 C)  SpO2: (!) 87%

## 2023-05-18 NOTE — Progress Notes (Signed)
 PHARMACIST - PHYSICIAN COMMUNICATION  CONCERNING:  Enoxaparin (Lovenox) for DVT Prophylaxis   RECOMMENDATION: Patient was prescribed enoxaparin 40mg  q24 hours for VTE prophylaxis.   Filed Weights   05/18/23 1617  Weight: 117 kg (257 lb 15 oz)    Body mass index is 33.12 kg/m.  Estimated Creatinine Clearance: 135.1 mL/min (by C-G formula based on SCr of 0.77 mg/dL).  Based on [facility] policy patient is candidate for enoxaparin 0.5mg /kg TBW SQ every 24 hours based on BMI being >30.  DESCRIPTION: Pharmacy has adjusted enoxaparin dose per [facility] policy.  Patient is now receiving enoxaparin 55 mg every 24 hours    Tressie Ellis 05/18/2023 8:57 PM

## 2023-05-19 ENCOUNTER — Ambulatory Visit: Payer: Medicare Other | Admitting: Internal Medicine

## 2023-05-19 DIAGNOSIS — E785 Hyperlipidemia, unspecified: Secondary | ICD-10-CM

## 2023-05-19 DIAGNOSIS — R7989 Other specified abnormal findings of blood chemistry: Secondary | ICD-10-CM

## 2023-05-19 DIAGNOSIS — A419 Sepsis, unspecified organism: Secondary | ICD-10-CM | POA: Diagnosis not present

## 2023-05-19 DIAGNOSIS — J189 Pneumonia, unspecified organism: Secondary | ICD-10-CM | POA: Diagnosis not present

## 2023-05-19 DIAGNOSIS — I2510 Atherosclerotic heart disease of native coronary artery without angina pectoris: Secondary | ICD-10-CM | POA: Insufficient documentation

## 2023-05-19 DIAGNOSIS — E039 Hypothyroidism, unspecified: Secondary | ICD-10-CM | POA: Insufficient documentation

## 2023-05-19 DIAGNOSIS — K219 Gastro-esophageal reflux disease without esophagitis: Secondary | ICD-10-CM | POA: Insufficient documentation

## 2023-05-19 LAB — HEPATIC FUNCTION PANEL
ALT: 6 U/L (ref 0–44)
AST: 10 U/L — ABNORMAL LOW (ref 15–41)
Albumin: 2.4 g/dL — ABNORMAL LOW (ref 3.5–5.0)
Alkaline Phosphatase: 82 U/L (ref 38–126)
Bilirubin, Direct: 0.1 mg/dL (ref 0.0–0.2)
Indirect Bilirubin: 0.5 mg/dL (ref 0.3–0.9)
Total Bilirubin: 0.6 mg/dL (ref 0.0–1.2)
Total Protein: 6 g/dL — ABNORMAL LOW (ref 6.5–8.1)

## 2023-05-19 LAB — CBC
HCT: 33.5 % — ABNORMAL LOW (ref 39.0–52.0)
Hemoglobin: 11.1 g/dL — ABNORMAL LOW (ref 13.0–17.0)
MCH: 30.3 pg (ref 26.0–34.0)
MCHC: 33.1 g/dL (ref 30.0–36.0)
MCV: 91.5 fL (ref 80.0–100.0)
Platelets: 385 10*3/uL (ref 150–400)
RBC: 3.66 MIL/uL — ABNORMAL LOW (ref 4.22–5.81)
RDW: 13.3 % (ref 11.5–15.5)
WBC: 14.2 10*3/uL — ABNORMAL HIGH (ref 4.0–10.5)
nRBC: 0 % (ref 0.0–0.2)

## 2023-05-19 LAB — BASIC METABOLIC PANEL
Anion gap: 13 (ref 5–15)
BUN: 15 mg/dL (ref 6–20)
CO2: 22 mmol/L (ref 22–32)
Calcium: 8.7 mg/dL — ABNORMAL LOW (ref 8.9–10.3)
Chloride: 101 mmol/L (ref 98–111)
Creatinine, Ser: 0.72 mg/dL (ref 0.61–1.24)
GFR, Estimated: >60 mL/min (ref >60)
Glucose, Bld: 180 mg/dL — ABNORMAL HIGH (ref 70–99)
Potassium: 3.8 mmol/L (ref 3.5–5.1)
Sodium: 136 mmol/L (ref 135–145)

## 2023-05-19 LAB — STREP PNEUMONIAE URINARY ANTIGEN: Strep Pneumo Urinary Antigen: NEGATIVE

## 2023-05-19 LAB — PROTIME-INR
INR: 1.1 (ref 0.8–1.2)
Prothrombin Time: 14.2 s (ref 11.4–15.2)

## 2023-05-19 LAB — CORTISOL-AM, BLOOD: Cortisol - AM: 4.7 ug/dL — ABNORMAL LOW (ref 6.7–22.6)

## 2023-05-19 LAB — HIV ANTIBODY (ROUTINE TESTING W REFLEX): HIV Screen 4th Generation wRfx: NONREACTIVE

## 2023-05-19 MED ORDER — VITAMIN D (ERGOCALCIFEROL) 1.25 MG (50000 UNIT) PO CAPS
50000.0000 [IU] | ORAL_CAPSULE | ORAL | Status: DC
  Administered 2023-05-19: 50000 [IU] via ORAL
  Filled 2023-05-19: qty 1

## 2023-05-19 MED ORDER — ASPIRIN 81 MG PO TBEC
81.0000 mg | DELAYED_RELEASE_TABLET | Freq: Every day | ORAL | Status: DC
  Administered 2023-05-19 – 2023-05-21 (×3): 81 mg via ORAL
  Filled 2023-05-19 (×3): qty 1

## 2023-05-19 MED ORDER — CARVEDILOL 12.5 MG PO TABS
25.0000 mg | ORAL_TABLET | Freq: Two times a day (BID) | ORAL | Status: DC
  Administered 2023-05-19 – 2023-05-21 (×5): 25 mg via ORAL
  Filled 2023-05-19: qty 8
  Filled 2023-05-19 (×4): qty 2

## 2023-05-19 MED ORDER — AMLODIPINE BESYLATE 5 MG PO TABS
5.0000 mg | ORAL_TABLET | Freq: Every day | ORAL | Status: DC
  Administered 2023-05-19 – 2023-05-21 (×3): 5 mg via ORAL
  Filled 2023-05-19 (×3): qty 1

## 2023-05-19 MED ORDER — PANTOPRAZOLE SODIUM 40 MG PO TBEC
40.0000 mg | DELAYED_RELEASE_TABLET | Freq: Every day | ORAL | Status: DC
  Administered 2023-05-19 – 2023-05-21 (×3): 40 mg via ORAL
  Filled 2023-05-19 (×3): qty 1

## 2023-05-19 MED ORDER — NITROGLYCERIN 0.4 MG SL SUBL: 0.4000 mg | SUBLINGUAL_TABLET | SUBLINGUAL | Status: DC | PRN

## 2023-05-19 MED ORDER — OXYMETAZOLINE HCL 0.05 % NA SOLN
1.0000 | Freq: Two times a day (BID) | NASAL | Status: DC | PRN
  Administered 2023-05-20: 1 via NASAL
  Filled 2023-05-19: qty 30

## 2023-05-19 MED ORDER — LEVOTHYROXINE SODIUM 112 MCG PO TABS
112.0000 ug | ORAL_TABLET | Freq: Every day | ORAL | Status: DC
  Administered 2023-05-19 – 2023-05-21 (×3): 112 ug via ORAL
  Filled 2023-05-19 (×3): qty 1

## 2023-05-19 MED ORDER — IRBESARTAN 150 MG PO TABS
150.0000 mg | ORAL_TABLET | Freq: Every day | ORAL | Status: DC
  Administered 2023-05-19 – 2023-05-21 (×3): 150 mg via ORAL
  Filled 2023-05-19 (×3): qty 1

## 2023-05-19 MED ORDER — HYDROCODONE-ACETAMINOPHEN 10-325 MG PO TABS
1.0000 | ORAL_TABLET | Freq: Every day | ORAL | Status: DC
  Administered 2023-05-19 – 2023-05-20 (×2): 1 via ORAL
  Filled 2023-05-19 (×2): qty 1

## 2023-05-19 MED ORDER — ATORVASTATIN CALCIUM 40 MG PO TABS: 40.0000 mg | ORAL_TABLET | Freq: Every day | ORAL | Status: DC

## 2023-05-19 MED ORDER — ADULT MULTIVITAMIN W/MINERALS CH
1.0000 | ORAL_TABLET | Freq: Every day | ORAL | Status: DC
  Administered 2023-05-19 – 2023-05-21 (×3): 1 via ORAL
  Filled 2023-05-19 (×3): qty 1

## 2023-05-19 MED ORDER — TICAGRELOR 60 MG PO TABS
60.0000 mg | ORAL_TABLET | Freq: Two times a day (BID) | ORAL | Status: DC
  Administered 2023-05-19 – 2023-05-21 (×5): 60 mg via ORAL
  Filled 2023-05-19 (×8): qty 1

## 2023-05-19 NOTE — Assessment & Plan Note (Signed)
-   We will hold off statin. - He will be hydrated with IV lactated ringer - We will follow LFTs.

## 2023-05-19 NOTE — Plan of Care (Signed)
  Problem: Respiratory: Goal: Ability to maintain adequate ventilation will improve Outcome: Progressing   Problem: Education: Goal: Knowledge of General Education information will improve Description: Including pain rating scale, medication(s)/side effects and non-pharmacologic comfort measures Outcome: Progressing   Problem: Health Behavior/Discharge Planning: Goal: Ability to manage health-related needs will improve Outcome: Progressing   

## 2023-05-19 NOTE — Progress Notes (Signed)
 Erroneous encounter - please disregard.

## 2023-05-19 NOTE — ED Notes (Addendum)
 Patient having nosebleeds from oxygen, Respiratory added humidity and MD made aware of bleeding.

## 2023-05-19 NOTE — Progress Notes (Signed)
 [facility] PROGRESS NOTE  AKIL HOOS (DOB: 1965/10/02) WUJ:[id number] PCP: Elfredia Nevins, MD  Brief Narrative: Brett Olson is a 58 y.o. male with a history of CAD, HTN, HLD, non-Hodgkin's lymphoma, hypothyroidism, tobacco use in remission among others who presented to the ED on 05/18/2023 with worsening dyspnea and cough, found to be hypoxemic, tachycardic, with low grade fever. He had leukocytosis (14k), and tested PCR positive for influenza A (already diagnosed 2/10 and treated). CTA chest revealed bilateral (R > L) airspace opacities suggestive of pneumonia. Ceftriaxone, azithromycin, steroids, nebs were given and he was admitted.   Subjective: Reports some improvement in shortness of breath but has just been confined to bed (still in ED this afternoon), feels very claustrophobic, doesn't like the food, had a nosebleed this morning that subsided, now has nasal cannula in mouth.   Objective: BP 121/75   Pulse 83   Temp 98 F (36.7 C) (Oral)   Resp 19   Ht 6\' 2"  (1.88 m)   Wt 117 kg   SpO2 93%   BMI 33.12 kg/m   Gen: No distress Pulm: No crackles or wheezes, nonlabored with supplemental oxygen  CV: RRR, no MRG or pitting edema GI: Soft, NT, ND, +BS  Neuro: Alert and oriented. No new focal deficits. Anxious. Ext: Warm, no deformities. Skin: No open wounds on visualized skin   Assessment & Plan: Sepsis due to multifocal pneumonia:  - Continue ceftriaxone, azithromycin - Add urine antigens, PCT, sputum culture if able - Continue mucolytic, antitussive - Continue nebs scheduled and prn. Wheezing is resolved and no known hx COPD, will hold further steroids for now.   - Pt able to take po, VSS, stop LR @150cc /hr. - Monitor blood cultures.  Influenza A: PCR positive 2/10 and now again 2/17, had prescription for tamiflu. - Supportive care  Epistaxis:  - Would strongly reconsider recommendation for lifelong brilinta as epistaxis has been cause for emergency room  presentations. Currently resolved. Will order afrin prn and humidify O2.    CAD s/p RCA PCI 2015, HLD: No chest pain currently. Troponin normal x3.  - Continue coreg, brilinta (was told to stay on this for the rest of his life), statin - We will continue Coreg, Brilinta and hold off statin therapy.   HTN:  - Continue norvasc, ARB, BB  Hypothyroidism:  - Continue synthroid  GERD:  - Continue PPI  Elevated LFTs: Resolved.  NHL: s/p chemo/XRT to head/neck c. 2014.   Tyrone Nine, MD [facility] [email or web address] 05/19/2023, 1:16 PM

## 2023-05-19 NOTE — Assessment & Plan Note (Signed)
-   We will continue antihypertensive therapy.

## 2023-05-19 NOTE — Assessment & Plan Note (Signed)
 -  We will continue Synthroid.

## 2023-05-19 NOTE — Assessment & Plan Note (Addendum)
-   We will continue Coreg, Brilinta and hold off statin therapy.

## 2023-05-19 NOTE — ED Notes (Signed)
 Pt states he does not like or want the door closed as he is "claustrophobic". Explained to pt that it is policy if pt is flu A positive we are to close doors. Pt refused and began getting agitated and told his family member to close the door.

## 2023-05-19 NOTE — Assessment & Plan Note (Signed)
-   The patient will be admitted to a medical telemetry bed. - Will continue antibiotic therapy with IV Rocephin and Zithromax. - Mucolytic therapy be provided as well as duo nebs q.i.d. and q.4 hours p.r.n. - We will follow blood cultures.

## 2023-05-19 NOTE — Assessment & Plan Note (Addendum)
-   We will hold off statin therapy given elevated LFTs
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
## 2023-05-19 NOTE — Assessment & Plan Note (Signed)
 -  We will continue PPI therapy
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
## 2023-05-19 NOTE — Progress Notes (Signed)
 Transition of Care Department [facility]) has reviewed patient and no other TOC needs have been identified at this time. We will continue to monitor patient advancement through interdisciplinary progression rounds. If new patient transition needs arise, please place a TOC consult.   05/19/23 0743  TOC Brief Assessment  Insurance and Status Reviewed  Patient has primary care physician Yes  Home environment has been reviewed Lives with wife and children.  Prior level of function: Independent.  Prior/Current Home Services No current home services  Social Drivers of Health Review SDOH reviewed no interventions necessary  Readmission risk has been reviewed Yes  Transition of care needs no transition of care needs at this time

## 2023-05-20 DIAGNOSIS — A419 Sepsis, unspecified organism: Secondary | ICD-10-CM | POA: Diagnosis not present

## 2023-05-20 DIAGNOSIS — J189 Pneumonia, unspecified organism: Secondary | ICD-10-CM | POA: Diagnosis not present

## 2023-05-20 LAB — RESPIRATORY PANEL BY PCR

## 2023-05-20 LAB — BASIC METABOLIC PANEL
Anion gap: 9 (ref 5–15)
BUN: 19 mg/dL (ref 6–20)
CO2: 26 mmol/L (ref 22–32)
Calcium: 8.4 mg/dL — ABNORMAL LOW (ref 8.9–10.3)
Chloride: 103 mmol/L (ref 98–111)
Creatinine, Ser: 0.85 mg/dL (ref 0.61–1.24)
GFR, Estimated: >60 mL/min (ref >60)
Glucose, Bld: 125 mg/dL — ABNORMAL HIGH (ref 70–99)
Potassium: 4.2 mmol/L (ref 3.5–5.1)
Sodium: 138 mmol/L (ref 135–145)

## 2023-05-20 LAB — CBC
HCT: 33 % — ABNORMAL LOW (ref 39.0–52.0)
Hemoglobin: 10.5 g/dL — ABNORMAL LOW (ref 13.0–17.0)
MCH: 30.3 pg (ref 26.0–34.0)
MCHC: 31.8 g/dL (ref 30.0–36.0)
MCV: 95.4 fL (ref 80.0–100.0)
Platelets: 381 10*3/uL (ref 150–400)
RBC: 3.46 MIL/uL — ABNORMAL LOW (ref 4.22–5.81)
RDW: 13.8 % (ref 11.5–15.5)
WBC: 17.6 10*3/uL — ABNORMAL HIGH (ref 4.0–10.5)
nRBC: 0 % (ref 0.0–0.2)

## 2023-05-20 LAB — PROCALCITONIN: Procalcitonin: 0.14 ng/mL

## 2023-05-20 MED ORDER — IPRATROPIUM-ALBUTEROL 0.5-2.5 (3) MG/3ML IN SOLN
3.0000 mL | Freq: Four times a day (QID) | RESPIRATORY_TRACT | Status: DC
  Administered 2023-05-20 – 2023-05-21 (×5): 3 mL via RESPIRATORY_TRACT
  Filled 2023-05-20 (×5): qty 3

## 2023-05-20 NOTE — Progress Notes (Signed)
 [facility] PROGRESS NOTE  Brett Olson (DOB: 07-11-1965) UJW:[id number] PCP: Elfredia Nevins, MD  Brief Narrative: Brett Olson is a 58 y.o. male with a history of CAD, HTN, HLD, non-Hodgkin's lymphoma, hypothyroidism, tobacco use in remission among others who presented to the ED on 05/18/2023 with worsening dyspnea and cough, found to be hypoxemic, tachycardic, with low grade fever. He had leukocytosis (14k), and tested PCR positive for influenza A (already diagnosed 2/10 and treated). CTA chest revealed bilateral (R > L) airspace opacities suggestive of pneumonia. Ceftriaxone, azithromycin, steroids, nebs were given and he was admitted.   Subjective: Reports some improvement in shortness of breath but has just been confined to bed (still in ED this afternoon), feels very claustrophobic, doesn't like the food, had a nosebleed this morning that subsided, now has nasal cannula in mouth.   Objective: BP 127/89 (BP Location: Left Arm)   Pulse 91   Temp (!) 97.2 F (36.2 C)   Resp 14   Ht 6\' 2"  (1.88 m)   Wt 117 kg   SpO2 90%   BMI 33.12 kg/m   Gen: No distress Pulm: No crackles or wheezes, nonlabored with supplemental oxygen  CV: RRR, no MRG or pitting edema GI: Soft, NT, ND, +BS  Neuro: Alert and oriented. No new focal deficits. Anxious. Ext: Warm, no deformities. Skin: No open wounds on visualized skin   Assessment & Plan: Sepsis due to multifocal pneumonia:  - Continue ceftriaxone, azithromycin - Add urine antigens, PCT, sputum culture if able - Continue mucolytic, antitussive - Continue nebs scheduled and prn. Wheezing is resolved and no known hx COPD, will hold further steroids for now.   - Pt able to take po, VSS, stop LR @150cc /hr. - Monitor blood cultures.  Influenza A: PCR positive 2/10 and now again 2/17, had prescription for tamiflu. - Supportive care  Epistaxis:  - Would strongly reconsider recommendation for lifelong brilinta as epistaxis has been cause  for emergency room presentations. Currently resolved. Will order afrin prn and humidify O2.    CAD s/p RCA PCI 2015, HLD: No chest pain currently. Troponin normal x3.  - Continue coreg, brilinta (was told to stay on this for the rest of his life), statin - We will continue Coreg, Brilinta and hold off statin therapy.   HTN:  - Continue norvasc, ARB, BB  Hypothyroidism:  - Continue synthroid  GERD:  - Continue PPI  Elevated LFTs: Resolved.  NHL: s/p chemo/XRT to head/neck c. 2014.   Shon Hale, MD [facility] [email or web address] 05/20/2023, 6:02 PM

## 2023-05-20 NOTE — Care Management Important Message (Signed)
 Important Message  Patient Details  Name: Brett Olson MRN: [id number] Date of Birth: 28-Nov-1965   Important Message Given:  Yes - Medicare IM (spoke with verbally at [phone])     Corey Harold 05/20/2023, 11:28 AM

## 2023-05-21 MED ORDER — ASPIRIN EC 81 MG PO TBEC: 81.0000 mg | DELAYED_RELEASE_TABLET | Freq: Every day | ORAL | 11 refills | Status: DC

## 2023-05-21 MED ORDER — PREDNISONE 20 MG PO TABS: 40.0000 mg | ORAL_TABLET | Freq: Every day | ORAL | 0 refills | Status: AC

## 2023-05-21 MED ORDER — SALINE SPRAY 0.65 % NA SOLN: 1.0000 | NASAL | Status: DC | PRN

## 2023-05-21 MED ORDER — AZITHROMYCIN 500 MG PO TABS
500.0000 mg | ORAL_TABLET | Freq: Every day | ORAL | 0 refills | Status: AC
Start: 2023-05-21 — End: 2023-05-24

## 2023-05-21 MED ORDER — COMPRESSOR/NEBULIZER MISC: 1.0000 [IU] | Freq: Every day | 0 refills | Status: AC | PRN

## 2023-05-21 MED ORDER — SALINE SPRAY 0.65 % NA SOLN: 1.0000 | NASAL | 0 refills | Status: DC | PRN

## 2023-05-21 MED ORDER — DM-GUAIFENESIN ER 30-600 MG PO TB12
1.0000 | ORAL_TABLET | Freq: Two times a day (BID) | ORAL | 0 refills | Status: DC | PRN
Start: 2023-05-21 — End: 2023-06-15

## 2023-05-21 MED ORDER — ALBUTEROL SULFATE (2.5 MG/3ML) 0.083% IN NEBU: 2.5000 mg | INHALATION_SOLUTION | RESPIRATORY_TRACT | 2 refills | Status: AC | PRN

## 2023-05-21 MED ORDER — ACETAMINOPHEN 325 MG PO TABS: 650.0000 mg | ORAL_TABLET | Freq: Four times a day (QID) | ORAL | Status: AC | PRN

## 2023-05-21 MED ORDER — CEFDINIR 300 MG PO CAPS: 300.0000 mg | ORAL_CAPSULE | Freq: Two times a day (BID) | ORAL | 0 refills | Status: AC

## 2023-05-21 NOTE — Progress Notes (Addendum)
 Patient ambulated without assistance approximately 62ft on room air without difficulty. O2 sat 89-90%, no SOB. Dr. Mariea Clonts made aware.

## 2023-05-21 NOTE — Discharge Summary (Signed)
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 DETECTED Final   Bordetella pertussis NOT DETECTED NOT DETECTED Final   Bordetella Parapertussis NOT DETECTED NOT DETECTED Final   Chlamydophila pneumoniae NOT DETECTED NOT DETECTED Final   Mycoplasma pneumoniae NOT DETECTED NOT DETECTED Final     Comment: Performed at [facility], [address] [place], [place] [address]    Today   Subjective    Brett Olson today has no new concerns -Had BM No fever  Or chills  -Cough and dyspnea improved      -Wife and patient's younger daughter at bedside questions answered -Post ambulation O2 sats  90% on room air -At rest O2 sats 93% on room air  Patient has been seen and examined prior to discharge   Objective   Blood pressure 132/82, pulse 94, temperature 97.9 F (36.6 C), temperature source Oral, resp. rate 16, height 6\' 2"  (1.88 m), weight 117 kg, SpO2 96%.   Intake/Output Summary (Last 24 hours) at 05/21/2023 1557 Last data filed at 05/21/2023 1230 Gross per 24 hour  Intake 1415.65 ml  Output --  Net 1415.65 ml    Exam Gen:- Awake Alert, no acute distress , speaking in sentences HEENT:- [place].AT, No sclera icterus Neck-Supple Neck,No JVD,.  Lungs-improved air movement, no wheezing  CV- S1, S2 normal, regular Abd-  +ve B.Sounds, Abd Soft, No tenderness, increased truncal adiposity    Extremity/Skin:- No  edema,   good pulses Psych-affect is appropriate, oriented x3 Neuro-no new focal deficits, no tremors    Data Review   CBC w Diff:  Lab Results  Component Value Date   WBC 17.6 (H) 05/20/2023   HGB 10.5 (L) 05/20/2023   HCT 33.0 (L) 05/20/2023   PLT 381 05/20/2023   LYMPHOPCT 10 05/18/2023   MONOPCT 10 05/18/2023   EOSPCT 0 05/18/2023   BASOPCT 0 05/18/2023    CMP:  Lab Results  Component Value Date   NA 138 05/20/2023   K 4.2 05/20/2023   CL 103 05/20/2023   CO2 26 05/20/2023   BUN 19 05/20/2023   CREATININE 0.85 05/20/2023   PROT 6.0 (L) 05/19/2023   ALBUMIN 2.4 (L) 05/19/2023   BILITOT 0.6 05/19/2023   ALKPHOS 82 05/19/2023   AST 10 (L) 05/19/2023   ALT 6 05/19/2023  .  Total Discharge time is about 33 minutes  Shon Hale M.D on 05/21/2023 at 3:57 PM  Go to [email or web address] -  for contact info  [facility] - Office   [phone]

## 2023-05-21 NOTE — Plan of Care (Signed)
  Problem: Clinical Measurements: Goal: Diagnostic test results will improve Outcome: Progressing Goal: Signs and symptoms of infection will decrease Outcome: Progressing   Problem: Respiratory: Goal: Ability to maintain adequate ventilation will improve Outcome: Progressing   Problem: Health Behavior/Discharge Planning: Goal: Ability to manage health-related needs will improve Outcome: Progressing   Problem: Clinical Measurements: Goal: Ability to maintain clinical measurements within normal limits will improve Outcome: Progressing Goal: Will remain free from infection Outcome: Progressing Goal: Diagnostic test results will improve Outcome: Progressing Goal: Respiratory complications will improve Outcome: Progressing Goal: Cardiovascular complication will be avoided Outcome: Progressing   Problem: Coping: Goal: Level of anxiety will decrease Outcome: Progressing

## 2023-05-21 NOTE — Progress Notes (Signed)
 Pt experienced non-stop nose bleeding. Pts wife suggested he be prescribed silver nitrate to stop the bleeding. Pt claimed it was given in the ER and that is the only remedy to stop the bleeding. Contacted on-call physician, instructed to follow PRN orders for Afrin and apply nose compression. Pt was agitated, stated this hospital has done nothing to help him. After calming pt down, advised pt to use Afrin and apply firm pressure to his nose with his head in an upright position. Bleeding stopped, pt in bed sleeping quietly. Rise and fall of chest visualized.

## 2023-05-21 NOTE — Discharge Instructions (Signed)
 1)You are taking Aspirin and Brilinta , so Please Avoid Meloxicam/Mobic, ibuprofen/Advil/Aleve/Motrin/Goody Powders/Naproxen/BC powders/Diclofenac/Indomethacin and other Nonsteroidal anti-inflammatory medications as these will make you more likely to bleed and can cause stomach ulcers, can also cause Kidney problems.   2) outpatient follow-up with ear nose and throat physician to discuss your recurrent nosebleeds advised  3) outpatient follow-up with the pulmonologist as previously advised  4) please reschedule your outpatient follow-up with your cardiologist

## 2023-05-23 LAB — CULTURE, BLOOD (ROUTINE X 2)
Culture: NO GROWTH
Culture: NO GROWTH
Special Requests: ADEQUATE
Special Requests: ADEQUATE

## 2023-05-23 LAB — LEGIONELLA PNEUMOPHILA SEROGP 1 UR AG: L. pneumophila Serogp 1 Ur Ag: NEGATIVE

## 2023-06-15 ENCOUNTER — Encounter: Payer: Self-pay | Admitting: Internal Medicine

## 2023-06-15 ENCOUNTER — Ambulatory Visit: Payer: Medicare Other | Attending: Internal Medicine | Admitting: Internal Medicine

## 2023-06-15 VITALS — BP 136/80 | HR 93 | Ht 74.0 in | Wt 251.8 lb

## 2023-06-15 DIAGNOSIS — G4733 Obstructive sleep apnea (adult) (pediatric): Secondary | ICD-10-CM | POA: Insufficient documentation

## 2023-06-15 NOTE — Patient Instructions (Addendum)
 Medication Instructions:  Your physician recommends that you continue on your current medications as directed. Please refer to the Current Medication list given to you today.   Labwork: None  Testing/Procedures: None  Follow-Up: Your physician recommends that you schedule a follow-up appointment in: 1 year. You will receive a reminder call in about 8 months reminding you to schedule your appointment. If you don't receive this call, please contact our office.   Any Other Special Instructions Will Be Listed Below (If Applicable). Referral to Sleep Medicine  Thank you for choosing [facility]!      If you need a refill on your cardiac medications before your next appointment, please call your pharmacy.
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
## 2023-06-29 ENCOUNTER — Ambulatory Visit (INDEPENDENT_AMBULATORY_CARE_PROVIDER_SITE_OTHER): Payer: Medicare Other | Admitting: Otolaryngology

## 2023-06-29 ENCOUNTER — Encounter (INDEPENDENT_AMBULATORY_CARE_PROVIDER_SITE_OTHER): Payer: Self-pay | Admitting: Otolaryngology

## 2023-06-29 ENCOUNTER — Other Ambulatory Visit (INDEPENDENT_AMBULATORY_CARE_PROVIDER_SITE_OTHER): Payer: Self-pay

## 2023-06-29 ENCOUNTER — Other Ambulatory Visit (INDEPENDENT_AMBULATORY_CARE_PROVIDER_SITE_OTHER): Payer: Self-pay | Admitting: Otolaryngology

## 2023-06-29 VITALS — BP 149/85 | HR 88 | Ht 74.0 in | Wt 250.0 lb

## 2023-06-29 DIAGNOSIS — R0982 Postnasal drip: Secondary | ICD-10-CM

## 2023-06-29 DIAGNOSIS — J3489 Other specified disorders of nose and nasal sinuses: Secondary | ICD-10-CM

## 2023-06-29 DIAGNOSIS — R040 Epistaxis: Secondary | ICD-10-CM

## 2023-06-29 DIAGNOSIS — J342 Deviated nasal septum: Secondary | ICD-10-CM

## 2023-06-29 DIAGNOSIS — R0981 Nasal congestion: Secondary | ICD-10-CM | POA: Diagnosis not present

## 2023-06-29 DIAGNOSIS — J343 Hypertrophy of nasal turbinates: Secondary | ICD-10-CM

## 2023-06-29 DIAGNOSIS — J3089 Other allergic rhinitis: Secondary | ICD-10-CM

## 2023-06-29 MED ORDER — CETIRIZINE HCL 10 MG PO TABS: 10.0000 mg | ORAL_TABLET | Freq: Every day | ORAL | 11 refills | Status: AC

## 2023-06-29 MED ORDER — SALINE SPRAY 0.65 % NA SOLN: 1.0000 | NASAL | 5 refills | Status: DC | PRN

## 2023-06-29 MED ORDER — OXYMETAZOLINE HCL 0.05 % NA SOLN: 1.0000 | Freq: Two times a day (BID) | NASAL | 0 refills | Status: DC

## 2023-06-29 MED ORDER — FLUTICASONE PROPIONATE 50 MCG/ACT NA SUSP: 2.0000 | Freq: Every day | NASAL | 6 refills | Status: AC
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
## 2023-06-29 NOTE — Patient Instructions (Addendum)
 Epistaxis prevention instructions given to the patient: - use nasal saline spray x6/day and Vaseline twice a day to prevent nose bleeds - for active nose bleeds use Afrin and nasal tip pressure x 10 min to stop them - if nose bleed does not stop with above measures, please go to Emergency Room  - please see your primary care provider to check your blood pressure and make sure it is under control - return for recurrent nose bleeds and we will consider cautery of your nasal blood vessels  - Purchase BleedStop to have at home and help with epistaxis control    Continue Zyrtec and start Flonase in 3 weeks  Schedule allergy testing

## 2023-07-08 ENCOUNTER — Ambulatory Visit: Payer: Medicare Other | Admitting: Emergency Medicine

## 2023-07-08 ENCOUNTER — Encounter: Payer: Self-pay | Admitting: Emergency Medicine

## 2023-07-08 VITALS — BP 126/74 | HR 89 | Ht 74.0 in | Wt 260.6 lb

## 2023-07-08 DIAGNOSIS — R0609 Other forms of dyspnea: Secondary | ICD-10-CM | POA: Diagnosis not present

## 2023-07-08 NOTE — Assessment & Plan Note (Signed)
 Suspect multifactorial.  Suspect that he does have some degree of COPD although his pulmonary function testing from 2018 done at [facility] was more consistent with restrictive disease on spirometry, question mixed disease.  Suspect restriction is at least part of his dyspnea due to obesity and possibly also to some radiation scarring.  Consider also secondary PAH that is untreated.  Is undergoing a repeat PSG, did not tolerate CPAP in the past so he may benefit from sleep referral to consider inspire device.  He had pulmonary infiltrates when he was hospitalized, suspect either viral or bacterial pneumonia, question possible component of volume overload.  Plan to repeat a CT to look for interval resolution.  Also will allow [place] to characterize any radiation related scarring   Okay to keep your albuterol available use 2 puffs or 1 nebulizer treatment every 4 hours if needed for shortness of breath, chest tightness, wheezing. We will arrange for pulmonary function testing at [facility] We will check alpha-1 antitrypsin blood work today Agree with getting your sleep study.  We will review this and determine whether to pursue CPAP, other options for treatment. Follow Dr. Delton Coombes in May after your breathing tests and CT scan of the chest have been done

## 2023-07-08 NOTE — Patient Instructions (Addendum)
 Okay to keep your albuterol available use 2 puffs or 1 nebulizer treatment every 4 hours if needed for shortness of breath, chest tightness, wheezing. We will arrange for pulmonary function testing at [facility] We will check alpha-1 antitrypsin blood work today We will plan to repeat a CT scan of the chest without contrast in May 2025 to compare with your prior. Agree with getting your sleep study.  We will review this and determine whether to pursue CPAP, other options for treatment. Follow Dr. Delton Coombes in May after your breathing tests and CT scan of the chest have been done
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
## 2023-07-14 LAB — ALPHA-1 ANTITRYPSIN PHENOTYPE: A-1 Antitrypsin, Ser: 151 mg/dL (ref 83–199)

## 2023-07-16 ENCOUNTER — Telehealth: Payer: Self-pay

## 2023-07-16 NOTE — Telephone Encounter (Signed)
 ATC x1 LMTCB for lab results  Please let the patient know that his alpha-1 antitrypsin genotype and levels are normal

## 2023-07-24 ENCOUNTER — Encounter: Payer: Self-pay | Admitting: Allergy & Immunology

## 2023-07-24 ENCOUNTER — Ambulatory Visit: Payer: Self-pay | Admitting: Allergy & Immunology

## 2023-07-24 ENCOUNTER — Other Ambulatory Visit: Payer: Self-pay

## 2023-07-24 VITALS — BP 120/62 | HR 101 | Temp 98.5°F | Resp 16 | Ht 72.0 in | Wt 258.2 lb

## 2023-07-24 DIAGNOSIS — B999 Unspecified infectious disease: Secondary | ICD-10-CM | POA: Diagnosis not present

## 2023-07-24 DIAGNOSIS — R0609 Other forms of dyspnea: Secondary | ICD-10-CM

## 2023-07-24 DIAGNOSIS — J310 Chronic rhinitis: Secondary | ICD-10-CM

## 2023-07-24 DIAGNOSIS — Z87891 Personal history of nicotine dependence: Secondary | ICD-10-CM | POA: Diagnosis not present

## 2023-07-24 NOTE — Progress Notes (Signed)
 NEW PATIENT  Date of Service/Encounter:  07/24/23  Consult requested by: [name], MD   Assessment:   Recurrent infections  DOE (dyspnea on exertion) - with full PFTs scheduled on May 20th   Chronic rhinitis - planning for skin testing at the next visit   Previous smoker (50ish years)   Plan/Recommendations:   1. Recurrent infections - We will obtain some screening labs to evaluate your immune system.  - Labs to evaluate the quantitative [facility]) aspects of your immune system: IgG/IgA/IgM, CBC with differential - Labs to evaluate the qualitative (HOW WELL THEY WORK) aspects of your immune system: CH50, Pneumococcal titers, Tetanus titers, Diphtheria titers - We may consider immunizations with Pneumovax and Tdap to challenge your immune system, and then obtain repeat titers in 4-6 weeks.   2. DOE (dyspnea on exertion) - Dr. Baldwin Levee is taking excellent care of you.  - I look forward to seeing what the full pulmonary function testing shows.   3. Chronic rhinitis - Because of insurance stipulations, we cannot do skin testing on the same day as your first visit. - We are all working to fight this, but for now we need to do two separate visits.  - We will know more after we do testing at the next visit.  - The skin testing visit can be squeezed in at your convenience.  - Then we can make a more full plan to address all of your symptoms. - Be sure to stop your antihistamines for 3 days before this appointment.   4. Return in about 1 week (around 07/31/2023) for SKIN TESTING (1-55). You can have the follow up appointment with Dr. Idolina Maker or a Nurse Practicioner (our Nurse Practitioners are excellent and always have Physician oversight!).   This note in its entirety was forwarded to the Provider who requested this consultation.  Subjective:   Brett Olson is a 58 y.o. male presenting today for evaluation of  Chief Complaint  Patient presents with   Establish Care    Allergy  Testing    For seasonal allergies    JOHNTAVIUS Olson has a history of the following: Patient Active Problem List   Diagnosis Date Noted   OSA (obstructive sleep apnea) 06/15/2023   Dyslipidemia 05/19/2023   Hypothyroidism 05/19/2023   GERD without esophagitis 05/19/2023   Coronary artery disease 05/19/2023   Elevated LFTs 05/19/2023   ERRONEOUS ENCOUNTER--DISREGARD 05/19/2023   Sepsis due to pneumonia (HCC) 05/18/2023   CAD (coronary artery disease) 02/11/2023   Long term (current) use of antithrombotics/antiplatelets 02/11/2023   Alcohol use 02/11/2023   Essential hypertension 01/09/2023   DOE (dyspnea on exertion) 01/08/2023   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  Discussed the use of AI scribe software for clinical note transcription with the patient and/or guardian, who gave verbal consent to proceed.  [name] was referred by [name], MD.     Brett Olson is a 58 y.o. male presenting for an evaluation of environmental allergies and recurrent infections .         Asthma/Respiratory Symptom History: He has been experiencing breathing difficulties since a hospitalization in February for influenza A, during which he was provided with a nebulizer. This marked the first significant episode of breathing issues. He does not use albuterol  inhalers regularly.  This is actually the first time that he has needed it. He is seeing Dr. Baldwin Levee in Pulmonology.   He has a history of COPD, likely related to a long history of smoking. He quit smoking in 2016 after smoking since 1976. He denies frequent wheezing but describes a sensation of 'a hormone program' affecting his breathing. He has received a pneumonia vaccination as well as over vaccinations. He is diligent with vaccinations  because his mother is in a facility that requires visitors take vaccines.   He reports issues with saliva production due to radiation treatment for non-Hodgkin lymphoma cancer, which affects his ability to manage respiratory symptoms. 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  Allergic Rhinitis Symptom History: He does have some occasional allergic rhinitis symptoms. He has never been tested in the past. He does not get sick frequently.   He has a history of heart disease, having received two stents following a heart attack. No current cardiac symptoms are mentioned. He did not needed open heart surgery.   He is retired, having worked for many years before retiring 18 years ago. 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 His daughter and three grandchildren live with him.   Otherwise, there is no history of other atopic diseases, including drug allergies, stinging insect allergies, or contact dermatitis. There is no significant infectious history. Vaccinations are up to date.    Past Medical History: Patient Active Problem List   Diagnosis Date Noted   OSA (obstructive sleep apnea) 06/15/2023   Dyslipidemia 05/19/2023   Hypothyroidism 05/19/2023   GERD without esophagitis 05/19/2023   Coronary artery disease 05/19/2023   Elevated LFTs 05/19/2023   ERRONEOUS ENCOUNTER--DISREGARD 05/19/2023   Sepsis due to pneumonia (HCC) 05/18/2023   CAD (coronary artery disease) 02/11/2023   Long term (current) use of antithrombotics/antiplatelets 02/11/2023   Alcohol use 02/11/2023   Essential hypertension 01/09/2023   DOE (dyspnea on exertion) 01/08/2023   S/P right UKR 11/04/2018   GERD (gastroesophageal reflux disease) 07/01/2017   Taking medication for chronic disease 07/01/2017   Diarrhea 07/01/2017   S/P left UKR 01/21/2016    Medication List:  Allergies as of 07/24/2023       Reactions   Morphine Other (See  Comments)   Lorazepam Other (See Comments)   Didn't sleep for days Nervousness Agitation    Morphine And Codeine Other (See Comments)   Nervousness Didn't sleep for days Agitation         Medication List        Accurate as of July 24, 2023  5:07 PM. If you have any questions, ask your nurse or doctor.          acetaminophen  325 MG tablet Commonly known as: TYLENOL  Take 2 tablets (650 mg total) by mouth every 6 (six) hours as needed for mild pain (pain score 1-3) (or Fever >/= 101).   albuterol  (2.5 MG/3ML) 0.083% nebulizer solution Commonly known as: PROVENTIL  Take 3 mLs (2.5 mg total) by nebulization every 4 (four) hours as needed for wheezing or shortness of breath.   amLODipine  5 MG tablet Commonly known as: NORVASC  Take 1 tablet (5 mg total) by mouth daily.   aspirin  EC 81 MG tablet Take 1 tablet (81 mg total) by mouth daily with breakfast.   atorvastatin  40 MG tablet Commonly known as: LIPITOR Take 40 mg by mouth at bedtime.   Brilinta  60 MG Tabs tablet Generic drug: ticagrelor  Take 60 mg  by mouth 2 (two) times daily.   carvedilol  25 MG tablet Commonly known as: COREG  Take 1 tablet (25 mg total) by mouth 2 (two) times daily with a meal.   cetirizine  10 MG tablet Commonly known as: ZYRTEC  Take 1 tablet (10 mg total) by mouth daily.   Compressor/Nebulizer Misc 1 Units by Does not apply route daily as needed.   fluticasone  50 MCG/ACT nasal spray Commonly known as: FLONASE  Place 2 sprays into both nostrils daily.   HYDROcodone -acetaminophen  10-325 MG tablet Commonly known as: NORCO Take 1 tablet by mouth at bedtime.   levothyroxine  112 MCG tablet Commonly known as: SYNTHROID  Take 112 mcg by mouth daily.   loperamide 2 MG capsule Commonly known as: IMODIUM Take 2 mg by mouth in the morning and at bedtime.   meloxicam 7.5 MG tablet Commonly known as: MOBIC Take 7.5 mg by mouth daily.   montelukast 10 MG tablet Commonly known as:  SINGULAIR Take 10 mg by mouth daily.   MULTIVITAMIN ADULT PO Take 1 tablet by mouth daily.   nitroGLYCERIN  0.4 MG SL tablet Commonly known as: NITROSTAT  Place 1 tablet (0.4 mg total) under the tongue every 5 (five) minutes x 3 doses as needed for chest pain (if no relief after 2nd dose, proceed to ED or call 911).   omeprazole 40 MG capsule Commonly known as: PRILOSEC Take 40 mg by mouth daily.   oxymetazoline  0.05 % nasal spray Commonly known as: Afrin Nasal Spray Place 1 spray into both nostrils 2 (two) times daily.   sodium chloride  0.65 % Soln nasal spray Commonly known as: OCEAN Place 1 spray into both nostrils as needed.   Turmeric 500 MG Caps Take 1 capsule by mouth in the morning and at bedtime.   valsartan  160 MG tablet Commonly known as: DIOVAN  Take 160 mg by mouth daily.   Vitamin D  (Ergocalciferol ) 1.25 MG (50000 UNIT) Caps capsule Commonly known as: DRISDOL  Take 50,000 Units by mouth once a week.        Birth History: non-contributory  Developmental History: non-contributory  Past Surgical History: Past Surgical History:  Procedure Laterality Date   BACK SURGERY     1999   CARDIAC CATHETERIZATION  2016   COLONOSCOPY WITH PROPOFOL  N/A 08/13/2017   Procedure: COLONOSCOPY WITH PROPOFOL ;  Surgeon: Suzette Espy, MD;  Location: [facility];  Service: Endoscopy;  Laterality: N/A;  8:45am   HERNIA REPAIR     umbilical hernia repair   left shoulder surgery     times 2   LYMPH NODE BIOPSY  2001   Non-Hodgkins Lymphoma; in remission   PARTIAL KNEE ARTHROPLASTY Left 01/21/2016   Procedure: LEFT UNICOMPARTMENTAL KNEE;  Surgeon: Claiborne Crew, MD;  Location: [facility];  Service: Orthopedics;  Laterality: Left;   PARTIAL KNEE ARTHROPLASTY Right 11/04/2018   Procedure: RIGHT UNICOMPARTMENTAL KNEE ARTHROPLASTY- MEDIALLY;  Surgeon: Claiborne Crew, MD;  Location: [facility];  Service: Orthopedics;  Laterality: Right;  90 mins   POLYPECTOMY  08/13/2017   Procedure:  POLYPECTOMY;  Surgeon: Suzette Espy, MD;  Location: [facility];  Service: Endoscopy;;  cecal polyp hs, distal transverse colon polyp hs, descending colon polyps times 2   right shoulder surgery      torn meniscus repair     left      Family History: Family History  Adopted: Yes  Problem Relation Age of Onset   Colon cancer Neg Hx    Gastric cancer Neg Hx    Esophageal cancer  Neg Hx      Social History: Nareg lives at home with his family.  They live in a house that is 60 years old.  There is carpeting throughout the home.  They have a heat pump for heating and central cooling.  There are no animals inside or outside of the home.  There are dust mite covers on the bed, but not the pillows.  There is no tobacco exposure.  He is currently retired.  He used to work at the [employer].  There are no fumes, chemicals, or dust exposure.  There is no HEPA filter in the home.  He does not live near an interstate or industrial area.   Review of systems otherwise negative other than that mentioned in the HPI.    Objective:   Blood pressure 120/62, pulse (!) 101, temperature 98.5 F (36.9 C), resp. rate 16, height 6' (1.829 m), weight 258 lb 3.2 oz (117.1 kg), SpO2 93%. Body mass index is 35.02 kg/m.     Physical Exam Vitals reviewed.  Constitutional:      Appearance: He is well-developed.     Comments: Talkative.   HENT:     Head: Normocephalic and atraumatic.     Right Ear: Tympanic membrane, ear canal and external ear normal. No drainage, swelling or tenderness. Tympanic membrane is not injected, scarred, erythematous, retracted or bulging.     Left Ear: Tympanic membrane, ear canal and external ear normal. No drainage, swelling or tenderness. Tympanic membrane is not injected, scarred, erythematous, retracted or bulging.     Nose: No nasal deformity, septal deviation, mucosal edema or rhinorrhea.     Right Turbinates: Enlarged, swollen and pale.     Left  Turbinates: Enlarged, swollen and pale.     Right Sinus: No maxillary sinus tenderness or frontal sinus tenderness.     Left Sinus: No maxillary sinus tenderness or frontal sinus tenderness.     Mouth/Throat:     Mouth: Mucous membranes are not pale and not dry.     Pharynx: Uvula midline.  Eyes:     General:        Right eye: No discharge.        Left eye: No discharge.     Conjunctiva/sclera: Conjunctivae normal.     Right eye: Right conjunctiva is not injected. No chemosis.    Left eye: Left conjunctiva is not injected. No chemosis.    Pupils: Pupils are equal, round, and reactive to light.  Cardiovascular:     Rate and Rhythm: Normal rate and regular rhythm.     Heart sounds: Normal heart sounds.  Pulmonary:     Effort: Pulmonary effort is normal. No tachypnea, accessory muscle usage or respiratory distress.     Breath sounds: Normal breath sounds. No wheezing, rhonchi or rales.  Chest:     Chest wall: No tenderness.  Abdominal:     Tenderness: There is no abdominal tenderness. There is no guarding or rebound.  Lymphadenopathy:     Head:     Right side of head: No submandibular, tonsillar or occipital adenopathy.     Left side of head: No submandibular, tonsillar or occipital adenopathy.     Cervical: No cervical adenopathy.  Skin:    Coloration: Skin is not pale.     Findings: No abrasion, erythema, petechiae or rash. Rash is not papular, urticarial or vesicular.  Neurological:     Mental Status: He is alert.  Psychiatric:  Behavior: Behavior is cooperative.      Diagnostic studies: labs sent instead       Drexel Gentles, MD [facility] 

## 2023-07-24 NOTE — Patient Instructions (Addendum)
 1. Recurrent infections - We will obtain some screening labs to evaluate your immune system.  - Labs to evaluate the quantitative [facility]) aspects of your immune system: IgG/IgA/IgM, CBC with differential - Labs to evaluate the qualitative (HOW WELL THEY WORK) aspects of your immune system: CH50, Pneumococcal titers, Tetanus titers, Diphtheria titers - We may consider immunizations with Pneumovax and Tdap to challenge your immune system, and then obtain repeat titers in 4-6 weeks.   2. DOE (dyspnea on exertion) - Dr. Baldwin Levee is taking excellent care of you.  - I look forward to seeing what the full pulmonary function testing shows.   3. Chronic rhinitis - Because of insurance stipulations, we cannot do skin testing on the same day as your first visit. - We are all working to fight this, but for now we need to do two separate visits.  - We will know more after we do testing at the next visit.  - The skin testing visit can be squeezed in at your convenience.  - Then we can make a more full plan to address all of your symptoms. - Be sure to stop your antihistamines for 3 days before this appointment.   4. Return in about 1 week (around 07/31/2023) for SKIN TESTING (1-55). You can have the follow up appointment with Dr. Idolina Maker or a Nurse Practicioner (our Nurse Practitioners are excellent and always have Physician oversight!).    Please inform us  of any Emergency Department visits, hospitalizations, or changes in symptoms. Call us  before going to the ED for breathing or allergy symptoms since we might be able to fit you in for a sick visit. Feel free to contact us  anytime with any questions, problems, or concerns.  It was a pleasure to meet you today!  Websites that have reliable patient information: 1. American Academy of Asthma, Allergy, and Immunology: www.aaaai.org 2. Food Allergy Research and Education (FARE): foodallergy.org 3. Mothers of Asthmatics:  http://www.asthmacommunitynetwork.org 4. American College of Allergy, Asthma, and Immunology: www.acaai.org      "Like" us  on Facebook and Instagram for our latest updates!      A healthy democracy works best when [employer] participate! Make sure you are registered to vote! If you have moved or changed any of your contact information, you will need to get this updated before voting! Scan the QR codes below to learn more!

## 2023-07-27 ENCOUNTER — Encounter: Payer: Self-pay | Admitting: Allergy & Immunology

## 2023-07-31 ENCOUNTER — Ambulatory Visit: Admitting: Allergy & Immunology

## 2023-07-31 ENCOUNTER — Encounter: Payer: Self-pay | Admitting: Allergy & Immunology

## 2023-07-31 DIAGNOSIS — J3089 Other allergic rhinitis: Secondary | ICD-10-CM | POA: Diagnosis not present

## 2023-07-31 DIAGNOSIS — B999 Unspecified infectious disease: Secondary | ICD-10-CM

## 2023-07-31 DIAGNOSIS — J302 Other seasonal allergic rhinitis: Secondary | ICD-10-CM | POA: Diagnosis not present

## 2023-07-31 DIAGNOSIS — R0609 Other forms of dyspnea: Secondary | ICD-10-CM

## 2023-07-31 MED ORDER — CARBINOXAMINE MALEATE 4 MG PO TABS: 4.0000 mg | ORAL_TABLET | Freq: Two times a day (BID) | ORAL | 5 refills | Status: AC

## 2023-07-31 NOTE — Progress Notes (Signed)
 FOLLOW UP  Date of Service/Encounter:  07/31/23   Assessment:   Recurrent infections   DOE (dyspnea on exertion) - with full PFTs scheduled on May 20th    Chronic rhinitis - planning for skin testing at the next visit    Previous smoker (50ish years)   Plan/Recommendations:   1. Recurrent infections - We are still waiting for some labs to return. - Everything is looking normal so far. - We will call you in 1-2 weeks with the results of the testing.   2. DOE (dyspnea on exertion) - Dr. Baldwin Levee is taking excellent care of you.  - I look forward to seeing what the full pulmonary function testing shows.   3. Chronic rhinitis - Testing today showed: indoor molds, outdoor molds, dust mites, cat, and dog - Copy of test results provided.  - Avoidance measures provided. - Stop taking: your current medications  - Start taking: carbinoxamine 4mg  twice daily to help with postnasal drip  - You can use an extra dose of the antihistamine, if needed, for breakthrough symptoms.  - Consider nasal saline rinses 1-2 times daily to remove allergens from the nasal cavities as well as help with mucous clearance (this is especially helpful to do before the nasal sprays are given) - Consider allergy shots as a means of long-term control. - Allergy shots "re-train" and "reset" the immune system to ignore environmental allergens and decrease the resulting immune response to those allergens (sneezing, itchy watery eyes, runny nose, nasal congestion, etc).    - Allergy shots improve symptoms in 75-85% of patients.   4. Return in about 3 months (around 10/31/2023). You can have the follow up appointment with Dr. Idolina Maker or a Nurse Practicioner (our Nurse Practitioners are excellent and always have Physician oversight!).   Subjective:   Brett Olson is a 58 y.o. male presenting today for follow up of No chief complaint on file.   Brett Olson has a history of the following: Patient Active  Problem List   Diagnosis Date Noted   OSA (obstructive sleep apnea) 06/15/2023   Dyslipidemia 05/19/2023   Hypothyroidism 05/19/2023   GERD without esophagitis 05/19/2023   Coronary artery disease 05/19/2023   Elevated LFTs 05/19/2023   ERRONEOUS ENCOUNTER--DISREGARD 05/19/2023   Sepsis due to pneumonia (HCC) 05/18/2023   CAD (coronary artery disease) 02/11/2023   Long term (current) use of antithrombotics/antiplatelets 02/11/2023   Alcohol use 02/11/2023   Essential hypertension 01/09/2023   DOE (dyspnea on exertion) 01/08/2023   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  Discussed the use of AI scribe software for clinical note transcription with the patient and/or guardian, who gave verbal consent to proceed.  Brett Olson is a 58 y.o. male presenting for skin testing. He was last seen on April 25. We could not do testing because his insurance company does not cover testing on the same day as a New Patient visit. He has been off of all antihistamines 3 days in anticipation of the testing.   At that time, we did an immune workup because of his recurrent sinusitis.  For his dyspnea, he was already following with pulmonology.  For his rhinitis, we decided to do environmental allergy testing.  We were trying to see whether that was related to his dyspnea.  His allergic rhinitis  symptoms were pretty minimal.  Otherwise, there have been no changes to his past medical history, surgical history, family history, or social history.    Review of systems otherwise negative other than that mentioned in the HPI.    Objective:   There were no vitals taken for this visit. There is no height or weight on file to calculate BMI.    Physical exam deferred since this was a skin testing appointment only.    Diagnostic studies:   Allergy Studies:     Airborne Adult Perc - 07/31/23 1000     Time Antigen Placed 1013    Allergen Manufacturer Floyd Hutchinson    Location Back    Number of Test 55    Panel 1 Select    1. Control-Buffer 50% Glycerol Negative    2. Control-Histamine 2+    3. Bahia Negative    4. French Southern Territories Negative    5. Johnson Negative    6. [place]  Blue Negative    7. Meadow Fescue Negative    8. Perennial Rye Negative    9. Timothy Negative    10. Ragweed Mix Negative    11. Cocklebur Negative    12. Plantain,  English Negative    13. Baccharis Negative    14. Dog Fennel Negative    15. Russian Thistle Negative    16. Lamb's Quarters Negative    17. Sheep Sorrell Negative    18. Rough Pigweed Negative    19. Marsh Elder, Rough Negative    20. Mugwort, Common Negative    21. Box, Elder Negative    22. Cedar, red Negative    23. Sweet Gum Negative    24. Pecan Pollen Negative    25. Pine Mix Negative    26. Walnut, Black Pollen Negative    27. Red Mulberry Negative    28. Ash Mix Negative    29. Birch Mix Negative    30. Beech American Negative    31. Cottonwood, Guinea[place] Negative    32. Hickory, White Negative    33. Maple Mix Negative    34. Oak, [place] Mix Negative    35. Sycamore Eastern Negative    36. Alternaria Alternata Negative    37. Cladosporium Herbarum Negative    38. Aspergillus Mix Negative    39. Penicillium Mix Negative    40. Bipolaris Sorokiniana (Helminthosporium) Negative    41. Drechslera Spicifera (Curvularia) Negative    42. Mucor Plumbeus Negative    43. Fusarium Moniliforme Negative    44. Aureobasidium Pullulans (pullulara) Negative    45. Rhizopus Oryzae Negative    46. Botrytis Cinera Negative    47. Epicoccum Nigrum Negative    48. Phoma Betae Negative    49. Dust Mite Mix Negative    50. Cat Hair 10,000 BAU/ml Negative    51.  Dog Epithelia Negative    52. Mixed Feathers Negative    53. Horse Epithelia Negative    54.  Cockroach, German Negative    55. Tobacco Leaf Negative             Intradermal - 07/31/23 1048     Time Antigen Placed 1100    Allergen Manufacturer Greer    Location Arm    Number of Test 16    Control Negative    Bahia Negative    French Southern Territories Negative    Johnson Negative    7 Grass Negative    Ragweed Mix Negative    Weed Mix Negative  Tree Mix Negative    Mold 1 3+    Mold 2 3+    Mold 3 2+    Mold 4 3+    Mite Mix 2+    Cat 3+    Dog 2+    Cockroach Negative             Allergy testing results were read and interpreted by myself, documented by clinical staff.      Drexel Gentles, MD  [facility] 

## 2023-07-31 NOTE — Patient Instructions (Addendum)
 1. Recurrent infections - We are still waiting for some labs to return. - Everything is looking normal so far. - We will call you in 1-2 weeks with the results of the testing.   2. DOE (dyspnea on exertion) - Dr. Baldwin Levee is taking excellent care of you.  - I look forward to seeing what the full pulmonary function testing shows.   3. Chronic rhinitis - Testing today showed: indoor molds, outdoor molds, dust mites, cat, and dog - Copy of test results provided.  - Avoidance measures provided. - Stop taking: your current medications  - Start taking: carbinoxamine 4mg  twice daily to help with postnasal drip  - You can use an extra dose of the antihistamine, if needed, for breakthrough symptoms.  - Consider nasal saline rinses 1-2 times daily to remove allergens from the nasal cavities as well as help with mucous clearance (this is especially helpful to do before the nasal sprays are given) - Consider allergy shots as a means of long-term control. - Allergy shots "re-train" and "reset" the immune system to ignore environmental allergens and decrease the resulting immune response to those allergens (sneezing, itchy watery eyes, runny nose, nasal congestion, etc).    - Allergy shots improve symptoms in 75-85% of patients.   4. Return in about 3 months (around 10/31/2023). You can have the follow up appointment with Dr. Idolina Maker or a Nurse Practicioner (our Nurse Practitioners are excellent and always have Physician oversight!).    Please inform us  of any Emergency Department visits, hospitalizations, or changes in symptoms. Call us  before going to the ED for breathing or allergy symptoms since we might be able to fit you in for a sick visit. Feel free to contact us  anytime with any questions, problems, or concerns.  It was a pleasure to meet you today!  Websites that have reliable patient information: 1. American Academy of Asthma, Allergy, and Immunology: www.aaaai.org 2. Food Allergy Research  and Education (FARE): foodallergy.org 3. Mothers of Asthmatics: http://www.asthmacommunitynetwork.org 4. American College of Allergy, Asthma, and Immunology: www.acaai.org      "Like" us  on Facebook and Instagram for our latest updates!      A healthy democracy works best when [employer] participate! Make sure you are registered to vote! If you have moved or changed any of your contact information, you will need to get this updated before voting! Scan the QR codes below to learn more!       Airborne Adult Perc - 07/31/23 1000     Time Antigen Placed 1013    Allergen Manufacturer Floyd Hutchinson    Location Back    Number of Test 55    Panel 1 Select    1. Control-Buffer 50% Glycerol Negative    2. Control-Histamine 2+    3. Bahia Negative    4. French [place] Territ[place] Negative    5. Johnson Negative    6. [place]  Blue Negative    7. Meadow Fescue Negative    8. Perennial Rye Negative    9. Timothy Negative    10. Ragweed Mix Negative    11. Cocklebur Negative    12. Plantain,  English Negative    13. Baccharis Negative    14. Dog Fennel Negative    15. Russian Thistle Negative    16. Lamb's Quarters Negative    17. Sheep Sorrell Negative    18. Rough Pigweed Negative    19. Marsh Elder, Rough Negative    20. Mugwort, Common Negative    21. Box,  Elder Negative    22. Cedar, red Negative    23. Sweet Gum Negative    24. Pecan Pollen Negative    25. Pine Mix Negative    26. Walnut, Black Pollen Negative    27. Red Mulberry Negative    28. Ash Mix Negative    29. Birch Mix Negative    30. Beech American Negative    31. Cottonwood, Guinea-Bissau Negative    32. Hickory, White Negative    33. Maple Mix Negative    34. Oak, [place] Mix Negative    35. Sycamore Eastern Negative    36. Alternaria Alternata Negative    37. Cladosporium Herbarum Negative    38. Aspergillus Mix Negative    39. Penicillium Mix Negative    40. Bipolaris Sorokiniana (Helminthosporium) Negative    41. Drechslera  Spicifera (Curvularia) Negative    42. Mucor Plumbeus Negative    43. Fusarium Moniliforme Negative    44. Aureobasidium Pullulans (pullulara) Negative    45. Rhizopus Oryzae Negative    46. Botrytis Cinera Negative    47. Epicoccum Nigrum Negative    48. Phoma Betae Negative    49. Dust Mite Mix Negative    50. Cat Hair 10,000 BAU/ml Negative    51.  Dog Epithelia Negative    52. Mixed Feathers Negative    53. Horse Epithelia Negative    54. Cockroach, German Negative    55. Tobacco Leaf Negative             Intradermal - 07/31/23 1048     Time Antigen Placed 1100    Allergen Manufacturer Greer    Location Arm    Number of Test 16    Control Negative    Bahia Negative    French Southern Territories Negative    Johnson Negative    7 Grass Negative    Ragweed Mix Negative    Weed Mix Negative    Tree Mix Negative    Mold 1 3+    Mold 2 3+    Mold 3 2+    Mold 4 3+    Mite Mix 2+    Cat 3+    Dog 2+    Cockroach Negative             Control of Mold Allergen   Mold and fungi can grow on a variety of surfaces provided certain temperature and moisture conditions exist.  Outdoor molds grow on plants, decaying vegetation and soil.  The major outdoor mold, Alternaria and Cladosporium, are found in very high numbers during hot and dry conditions.  Generally, a late Summer - Fall peak is seen for common outdoor fungal spores.  Rain will temporarily lower outdoor mold spore count, but counts rise rapidly when the rainy period ends.  The most important indoor molds are Aspergillus and Penicillium.  Dark, humid and poorly ventilated basements are ideal sites for mold growth.  The next most common sites of mold growth are the bathroom and the kitchen.  Outdoor (Seasonal) Mold Control  Positive outdoor molds via skin testing: Alternaria, Cladosporium, Bipolaris (Helminthsporium), Drechslera (Curvalaria), and Mucor  Use air conditioning and keep windows closed Avoid exposure to decaying  vegetation. Avoid leaf raking. Avoid grain handling. Consider wearing a face mask if working in moldy areas.    Indoor (Perennial) Mold Control   Positive indoor molds via skin testing: Aspergillus, Penicillium, Fusarium, Aureobasidium (Pullulara), and Rhizopus  Maintain humidity below 50%. Clean washable surfaces with 5% bleach solution.  Remove sources e.g. contaminated carpets.    Control of Dust Mite Allergen    Dust mites play a major role in allergic asthma and rhinitis.  They occur in environments with high humidity wherever human skin is found.  Dust mites absorb humidity from the atmosphere (ie, they do not drink) and feed on organic matter (including shed human and animal skin).  Dust mites are a microscopic type of insect that you cannot see with the naked eye.  High levels of dust mites have been detected from mattresses, pillows, carpets, upholstered furniture, bed covers, clothes, soft toys and any woven material.  The principal allergen of the dust mite is found in its feces.  A gram of dust may contain 1,000 mites and 250,000 fecal particles.  Mite antigen is easily measured in the air during house cleaning activities.  Dust mites do not bite and do not cause harm to humans, other than by triggering allergies/asthma.    Ways to decrease your exposure to dust mites in your home:  Encase mattresses, box springs and pillows with a mite-impermeable barrier or cover   Wash sheets, blankets and drapes weekly in hot water  (130 F) with detergent and dry them in a dryer on the hot setting.  Have the room cleaned frequently with a vacuum cleaner and a damp dust-mop.  For carpeting or rugs, vacuuming with a vacuum cleaner equipped with a high-efficiency particulate air (HEPA) filter.  The dust mite allergic individual should not be in a room which is being cleaned and should wait 1 hour after cleaning before going into the room. Do not sleep on upholstered furniture (eg, couches).    If possible removing carpeting, upholstered furniture and drapery from the home is ideal.  Horizontal blinds should be eliminated in the rooms where the person spends the most time (bedroom, study, television room).  Washable vinyl, roller-type shades are optimal. Remove all non-washable stuffed toys from the bedroom.  Wash stuffed toys weekly like sheets and blankets above.   Reduce indoor humidity to less than 50%.  Inexpensive humidity monitors can be purchased at most hardware stores.  Do not use a humidifier as can make the problem worse and are not recommended.  Control of Dog or Cat Allergen  Avoidance is the best way to manage a dog or cat allergy. If you have a dog or cat and are allergic to dog or cats, consider removing the dog or cat from the home. If you have a dog or cat but don't want to find it a new home, or if your family wants a pet even though someone in the household is allergic, here are some strategies that may help keep symptoms at bay:  Keep the pet out of your bedroom and restrict it to only a few rooms. Be advised that keeping the dog or cat in only one room will not limit the allergens to that room. Don't pet, hug or kiss the dog or cat; if you do, wash your hands with soap and water . High-efficiency particulate air (HEPA) cleaners run continuously in a bedroom or living room can reduce allergen levels over time. Regular use of a high-efficiency vacuum cleaner or a central vacuum can reduce allergen levels. Giving your dog or cat a bath at least once a week can reduce airborne allergen.  Allergy Shots  Allergies are the result of a chain reaction that starts in the immune system. Your immune system controls how your body defends itself. For instance, if you  have an allergy to pollen, your immune system identifies pollen as an invader or allergen. Your immune system overreacts by producing antibodies called Immunoglobulin E (IgE). These antibodies travel to cells that  release chemicals, causing an allergic reaction.  The concept behind allergy immunotherapy, whether it is received in the form of shots or tablets, is that the immune system can be desensitized to specific allergens that trigger allergy symptoms. Although it requires time and patience, the payback can be long-term relief. Allergy injections contain a dilute solution of those substances that you are allergic to based upon your skin testing and allergy history.   How Do Allergy Shots Work?  Allergy shots work much like a vaccine. Your body responds to injected amounts of a particular allergen given in increasing doses, eventually developing a resistance and tolerance to it. Allergy shots can lead to decreased, minimal or no allergy symptoms.  There generally are two phases: build-up and maintenance. Build-up often ranges from three to six months and involves receiving injections with increasing amounts of the allergens. The shots are typically given once or twice a week, though more rapid build-up schedules are sometimes used.  The maintenance phase begins when the most effective dose is reached. This dose is different for each person, depending on how allergic you are and your response to the build-up injections. Once the maintenance dose is reached, there are longer periods between injections, typically two to four weeks.  Occasionally doctors give cortisone-type shots that can temporarily reduce allergy symptoms. These types of shots are different and should not be confused with allergy immunotherapy shots.  Who Can Be Treated with Allergy Shots?  Allergy shots may be a good treatment approach for people with allergic rhinitis (hay fever), allergic asthma, conjunctivitis (eye allergy) or stinging insect allergy.   Before deciding to begin allergy shots, you should consider:   The length of allergy season and the severity of your symptoms  Whether medications and/or changes to your environment  can control your symptoms  Your desire to avoid long-term medication use  Time: allergy immunotherapy requires a major time commitment  Cost: may vary depending on your insurance coverage  Allergy shots for children age 73 and older are effective and often well tolerated. They might prevent the onset of new allergen sensitivities or the progression to asthma.  Allergy shots are not started on patients who are pregnant but can be continued on patients who become pregnant while receiving them. In some patients with other medical conditions or who take certain common medications, allergy shots may be of risk. It is important to mention other medications you talk to your allergist.   What are the two types of build-ups offered:   RUSH or Rapid Desensitization -- one day of injections lasting from 8:30-4:30pm, injections every 1 hour.  Approximately half of the build-up process is completed in that one day.  The following week, normal build-up is resumed, and this entails ~16 visits either weekly or twice weekly, until reaching your "maintenance dose" which is continued weekly until eventually getting spaced out to every month for a duration of 3 to 5 years. The regular build-up appointments are nurse visits where the injections are administered, followed by required monitoring for 30 minutes.    Traditional build-up -- weekly visits for 6 -12 months until reaching "maintenance dose", then continue weekly until eventually spacing out to every 4 weeks as above. At these appointments, the injections are administered, followed by required monitoring for 30 minutes.  Either way is acceptable, and both are equally effective. With the rush protocol, the advantage is that less time is spent here for injections overall AND you would also reach maintenance dosing faster (which is when the clinical benefit starts to become more apparent). Not everyone is a candidate for rapid desensitization.   IF we proceed  with the RUSH protocol, there are premedications which must be taken the day before and the day after the rush only (this includes antihistamines, steroids, and Singulair).  After the rush day, no prednisone  or Singulair is required, and we just recommend antihistamines taken on your injection day.  What Is An Estimate of the Costs?  If you are interested in starting allergy injections, please check with your insurance company about your coverage for both allergy vial sets and allergy injections.  Please do so prior to making the appointment to start injections.  The following are CPT codes to give to your insurance company. These are the amounts we BILL to the insurance company, but the amount YOU WILL PAY and WE RECEIVE IS SUBSTANTIALLY LESS and depends on the contracts we have with different insurance companies.   Amount Billed to Insurance One allergy vial set  CPT 95165   $ 1200     Two allergy vial set  CPT 95165   $ 2400     Three allergy vial set  CPT 95165   $ 3600     One injection   CPT 95115   $ 35  Two injections   CPT 95117   $ 40 RUSH (Rapid Desensitization) CPT 95180 x 8 hours $500/hour  Regarding the allergy injections, your co-pay may or may not apply with each injection, so please confirm this with your insurance company. When you start allergy injections, 1 or 2 sets of vials are made based on your allergies.  Not all patients can be on one set of vials. A set of vials lasts 6 months to a year depending on how quickly you can proceed with your build-up of your allergy injections. Vials are personalized for each patient depending on their specific allergens.  How often are allergy injection given during the build-up period?   Injections are given at least weekly during the build-up period until your maintenance dose is achieved. Per the doctor's discretion, you may have the option of getting allergy injections two times per week during the build-up period. However, there must be  at least 48 hours between injections. The build-up period is usually completed within 6-12 months depending on your ability to schedule injections and for adjustments for reactions. When maintenance dose is reached, your injection schedule is gradually changed to every two weeks and later to every three weeks. Injections will then continue every 4 weeks. Usually, injections are continued for a total of 3-5 years.   When Will I Feel Better?  Some may experience decreased allergy symptoms during the build-up phase. For others, it may take as long as 12 months on the maintenance dose. If there is no improvement after a year of maintenance, your allergist will discuss other treatment options with you.  If you aren't responding to allergy shots, it may be because there is not enough dose of the allergen in your vaccine or there are missing allergens that were not identified during your allergy testing. Other reasons could be that there are high levels of the allergen in your environment or major exposure to non-allergic triggers like tobacco smoke.  What Is the Length  of Treatment?  Once the maintenance dose is reached, allergy shots are generally continued for three to five years. The decision to stop should be discussed with your allergist at that time. Some people may experience a permanent reduction of allergy symptoms. Others may relapse and a longer course of allergy shots can be considered.  What Are the Possible Reactions?  The two types of adverse reactions that can occur with allergy shots are local and systemic. Common local reactions include very mild redness and swelling at the injection site, which can happen immediately or several hours after. Report a delayed reaction from your last injection. These include arm swelling or runny nose, watery eyes or cough that occurs within 12-24 hours after injection. A systemic reaction, which is less common, affects the entire body or a particular body  system. They are usually mild and typically respond quickly to medications. Signs include increased allergy symptoms such as sneezing, a stuffy nose or hives.   Rarely, a serious systemic reaction called anaphylaxis can develop. Symptoms include swelling in the throat, wheezing, a feeling of tightness in the chest, nausea or dizziness. Most serious systemic reactions develop within 30 minutes of allergy shots. This is why it is strongly recommended you wait in your doctor's office for 30 minutes after your injections. Your allergist is trained to watch for reactions, and his or her staff is trained and equipped with the proper medications to identify and treat them.   Report to the nurse immediately if you experience any of the following symptoms: swelling, itching or redness of the skin, hives, watery eyes/nose, breathing difficulty, excessive sneezing, coughing, stomach pain, diarrhea, or light headedness. These symptoms may occur within 15-20 minutes after injection and may require medication.   Who Should Administer Allergy Shots?  The preferred location for receiving shots is your prescribing allergist's office. Injections can sometimes be given at another facility where the physician and staff are trained to recognize and treat reactions, and have received instructions by your prescribing allergist.  What if I am late for an injection?   Injection dose will be adjusted depending upon how many days or weeks you are late for your injection.   What if I am sick?   Please report any illness to the nurse before receiving injections. She may adjust your dose or postpone injections depending on your symptoms. If you have fever, flu, sinus infection or chest congestion it is best to postpone allergy injections until you are better. Never get an allergy injection if your asthma is causing you problems. If your symptoms persist, seek out medical care to get your health problem under control.  What If I  am or Become Pregnant:  Women that become pregnant should schedule an appointment with [facility] before receiving any further allergy injections.

## 2023-08-01 ENCOUNTER — Other Ambulatory Visit (HOSPITAL_COMMUNITY)

## 2023-08-11 ENCOUNTER — Ambulatory Visit: Payer: Self-pay | Admitting: Allergy & Immunology

## 2023-08-12 ENCOUNTER — Ambulatory Visit (HOSPITAL_COMMUNITY)
Admission: RE | Admit: 2023-08-12 | Discharge: 2023-08-12 | Disposition: A | Discharge status: Home / Self Care | Source: Ambulatory Visit | Attending: Emergency Medicine | Admitting: Emergency Medicine

## 2023-08-12 DIAGNOSIS — R0609 Other forms of dyspnea: Secondary | ICD-10-CM | POA: Insufficient documentation

## 2023-08-13 ENCOUNTER — Ambulatory Visit: Admitting: Nurse Practitioner

## 2023-08-13 ENCOUNTER — Encounter: Payer: Self-pay | Admitting: Nurse Practitioner

## 2023-08-15 LAB — STREP PNEUMONIAE 23 SEROTYPES IGG
Pneumo Ab Type 1*: 4.9 ug/mL (ref >1.3)
Pneumo Ab Type 12 (12F)*: <0.1 ug/mL — ABNORMAL LOW (ref >1.3)
Pneumo Ab Type 14*: 3.9 ug/mL (ref >1.3)
Pneumo Ab Type 17 (17F)*: 3.1 ug/mL (ref >1.3)
Pneumo Ab Type 19 (19F)*: 6.5 ug/mL (ref >1.3)
Pneumo Ab Type 2*: 2.2 ug/mL (ref >1.3)
Pneumo Ab Type 20*: 2.6 ug/mL (ref >1.3)
Pneumo Ab Type 22 (22F)*: 0.7 ug/mL — ABNORMAL LOW (ref >1.3)
Pneumo Ab Type 23 (23F)*: 4.2 ug/mL (ref >1.3)
Pneumo Ab Type 26 (6B)*: <0.1 ug/mL — ABNORMAL LOW (ref >1.3)
Pneumo Ab Type 3*: 0.2 ug/mL — ABNORMAL LOW (ref >1.3)
Pneumo Ab Type 34 (10A)*: 12.6 ug/mL (ref >1.3)
Pneumo Ab Type 4*: 1.1 ug/mL — ABNORMAL LOW (ref >1.3)
Pneumo Ab Type 43 (11A)*: <0.1 ug/mL — ABNORMAL LOW (ref >1.3)
Pneumo Ab Type 5*: <0.1 ug/mL — ABNORMAL LOW (ref >1.3)
Pneumo Ab Type 51 (7F)*: 0.3 ug/mL — ABNORMAL LOW (ref >1.3)
Pneumo Ab Type 54 (15B)*: 3.8 ug/mL (ref >1.3)
Pneumo Ab Type 56 (18C)*: <0.1 ug/mL — ABNORMAL LOW (ref >1.3)
Pneumo Ab Type 57 (19A)*: 1.2 ug/mL — ABNORMAL LOW (ref >1.3)
Pneumo Ab Type 68 (9V)*: 2.6 ug/mL (ref >1.3)
Pneumo Ab Type 70 (33F)*: 5.6 ug/mL (ref >1.3)
Pneumo Ab Type 8*: 3.5 ug/mL (ref >1.3)
Pneumo Ab Type 9 (9N)*: 0.4 ug/mL — ABNORMAL LOW (ref >1.3)

## 2023-08-15 LAB — CBC WITH DIFF/PLATELET
Basophils Absolute: 0 10*3/uL (ref 0.0–0.2)
Basos: 0 %
EOS (ABSOLUTE): 0 10*3/uL (ref 0.0–0.4)
Eos: 0 %
Hematocrit: 38.1 % (ref 37.5–51.0)
Hemoglobin: 12.7 g/dL — ABNORMAL LOW (ref 13.0–17.7)
Immature Grans (Abs): 0 10*3/uL (ref 0.0–0.1)
Immature Granulocytes: 1 %
Lymphocytes Absolute: 0.9 10*3/uL (ref 0.7–3.1)
Lymphs: 11 %
MCH: 30.4 pg (ref 26.6–33.0)
MCHC: 33.3 g/dL (ref 31.5–35.7)
MCV: 91 fL (ref 79–97)
Monocytes Absolute: 0.6 10*3/uL (ref 0.1–0.9)
Monocytes: 8 %
Neutrophils Absolute: 6.7 10*3/uL (ref 1.4–7.0)
Neutrophils: 80 %
Platelets: 251 10*3/uL (ref 150–450)
RBC: 4.18 x10E6/uL (ref 4.14–5.80)
RDW: 13.1 % (ref 11.6–15.4)
WBC: 8.3 10*3/uL (ref 3.4–10.8)

## 2023-08-15 LAB — DIPHTHERIA / TETANUS ANTIBODY PANEL
Diphtheria Ab: <0.1 [IU]/mL — ABNORMAL LOW (ref <0.10)
Tetanus Ab, IgG: 0.15 [IU]/mL (ref <0.10)

## 2023-08-15 LAB — IGG, IGA, IGM
IgA/Immunoglobulin A, Serum: 204 mg/dL (ref 90–386)
IgG (Immunoglobin G), Serum: 885 mg/dL (ref 603–1613)
IgM (Immunoglobulin M), Srm: 184 mg/dL — ABNORMAL HIGH (ref 20–172)

## 2023-08-15 LAB — COMPLEMENT, TOTAL: Compl, Total (CH50): 50 U/mL (ref >41)

## 2023-08-18 ENCOUNTER — Ambulatory Visit (HOSPITAL_COMMUNITY)
Admission: RE | Admit: 2023-08-18 | Discharge: 2023-08-18 | Disposition: A | Discharge status: Home / Self Care | Source: Ambulatory Visit | Attending: Emergency Medicine | Admitting: Emergency Medicine

## 2023-08-18 DIAGNOSIS — R0609 Other forms of dyspnea: Secondary | ICD-10-CM | POA: Diagnosis present

## 2023-08-18 LAB — PULMONARY FUNCTION TEST
DL/VA % pred: 107 %
DL/VA: 4.46 ml/min/mmHg/L
DLCO unc % pred: 69 %
DLCO unc: 21.75 ml/min/mmHg
FEF 25-75 Post: 2.26 L/s
FEF 25-75 Pre: 1.78 L/s
FEF2575-%Change-Post: 26 %
FEF2575-%Pred-Post: 66 %
FEF2575-%Pred-Pre: 52 %
FEV1-%Change-Post: 7 %
FEV1-%Pred-Post: 62 %
FEV1-%Pred-Pre: 58 %
FEV1-Post: 2.61 L
FEV1-Pre: 2.42 L
FEV1FVC-%Change-Post: 1 %
FEV1FVC-%Pred-Pre: 94 %
FEV6-%Change-Post: 5 %
FEV6-%Pred-Post: 66 %
FEV6-%Pred-Pre: 62 %
FEV6-Post: 3.49 L
FEV6-Pre: 3.31 L
FEV6FVC-%Change-Post: 0 %
FEV6FVC-%Pred-Post: 102 %
FEV6FVC-%Pred-Pre: 102 %
FVC-%Change-Post: 6 %
FVC-%Pred-Post: 64 %
FVC-%Pred-Pre: 61 %
FVC-Post: 3.57 L
FVC-Pre: 3.36 L
Post FEV1/FVC ratio: 73 %
Post FEV6/FVC ratio: 98 %
Pre FEV1/FVC ratio: 72 %
Pre FEV6/FVC Ratio: 99 %
RV % pred: 78 %
RV: 1.91 L
TLC % pred: 73 %
TLC: 5.73 L

## 2023-08-18 MED ORDER — ALBUTEROL SULFATE (2.5 MG/3ML) 0.083% IN NEBU
2.5000 mg | INHALATION_SOLUTION | Freq: Once | RESPIRATORY_TRACT | Status: AC
  Administered 2023-08-18: 2.5 mg via RESPIRATORY_TRACT

## 2023-08-19 ENCOUNTER — Other Ambulatory Visit: Payer: Self-pay | Admitting: Internal Medicine

## 2023-09-16 ENCOUNTER — Encounter: Payer: Self-pay | Admitting: Emergency Medicine

## 2023-09-16 ENCOUNTER — Ambulatory Visit: Admitting: Emergency Medicine

## 2023-09-16 VITALS — BP 142/84 | HR 83 | Ht 74.0 in | Wt 265.0 lb

## 2023-09-16 DIAGNOSIS — G4733 Obstructive sleep apnea (adult) (pediatric): Secondary | ICD-10-CM | POA: Diagnosis not present

## 2023-09-16 DIAGNOSIS — J449 Chronic obstructive pulmonary disease, unspecified: Secondary | ICD-10-CM

## 2023-09-16 DIAGNOSIS — Z87891 Personal history of nicotine dependence: Secondary | ICD-10-CM | POA: Diagnosis not present

## 2023-09-16 DIAGNOSIS — J849 Interstitial pulmonary disease, unspecified: Secondary | ICD-10-CM

## 2023-09-16 NOTE — Assessment & Plan Note (Signed)
 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  Need to follow serial imaging to ensure no progression of ILD.  Next scan can be in 1 year.
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
## 2023-09-16 NOTE — Patient Instructions (Signed)
 We reviewed your CT scan of the chest today.  The areas of inflammation and pneumonitis seen in February have resolved.  There is still some residual interstitial change and scar.  We will likely repeat your CT chest in about 1 year. We reviewed your pulmonary function testing.  There is evidence for restrictive lung disease and also COPD present. We will try starting Stiolto, 2 puffs once daily.  Keep track of how this medication helps your breathing.  If it does then we will continue it going forward. We will arrange for a home sleep test to evaluate for suspected obstructive sleep apnea. We will postpone your sleep consultation that was scheduled for 09/23/2023 Follow with Dr. Baldwin Olson in about 2 months so we can see how you are doing on the inhaled medication and can review your sleep study.

## 2023-09-16 NOTE — Assessment & Plan Note (Signed)
 Evidence for obstruction on his pulmonary function testing.  We we will start Stiolto to see if he gets clinical benefit.  If so we will continue BD going forward

## 2023-09-16 NOTE — Assessment & Plan Note (Signed)
 Suspected OSA.  He could not tolerate CPAP in the past.  He is willing to retry.  We will arrange for a home sleep test.  I will postpone his sleep consultation in our office on 6/25 until he can get the PSG done

## 2023-09-21 ENCOUNTER — Ambulatory Visit: Admitting: Primary Care

## 2023-09-28 ENCOUNTER — Encounter

## 2023-09-28 DIAGNOSIS — G4733 Obstructive sleep apnea (adult) (pediatric): Secondary | ICD-10-CM

## 2023-10-20 ENCOUNTER — Other Ambulatory Visit: Payer: Self-pay | Admitting: Emergency Medicine

## 2023-10-20 ENCOUNTER — Encounter: Payer: Self-pay | Admitting: Emergency Medicine

## 2023-10-20 DIAGNOSIS — J849 Interstitial pulmonary disease, unspecified: Secondary | ICD-10-CM

## 2023-10-20 MED ORDER — STIOLTO RESPIMAT 2.5-2.5 MCG/ACT IN AERS: 2.0000 | INHALATION_SPRAY | Freq: Every day | RESPIRATORY_TRACT | 6 refills | Status: DC

## 2023-10-21 DIAGNOSIS — G4733 Obstructive sleep apnea (adult) (pediatric): Secondary | ICD-10-CM | POA: Diagnosis not present

## 2023-10-24 ENCOUNTER — Other Ambulatory Visit: Payer: Self-pay | Admitting: Internal Medicine

## 2023-11-02 ENCOUNTER — Ambulatory Visit: Admitting: Allergy & Immunology

## 2023-11-04 ENCOUNTER — Ambulatory Visit: Admitting: Emergency Medicine

## 2023-11-20 ENCOUNTER — Telehealth: Payer: Self-pay | Admitting: Primary Care

## 2023-11-20 ENCOUNTER — Telehealth (HOSPITAL_BASED_OUTPATIENT_CLINIC_OR_DEPARTMENT_OTHER): Admitting: Primary Care

## 2023-11-20 DIAGNOSIS — G4733 Obstructive sleep apnea (adult) (pediatric): Secondary | ICD-10-CM | POA: Diagnosis not present

## 2023-11-20 DIAGNOSIS — J449 Chronic obstructive pulmonary disease, unspecified: Secondary | ICD-10-CM | POA: Diagnosis not present

## 2023-11-20 DIAGNOSIS — J849 Interstitial pulmonary disease, unspecified: Secondary | ICD-10-CM

## 2023-11-20 MED ORDER — UMECLIDINIUM-VILANTEROL 62.5-25 MCG/ACT IN AEPB: 1.0000 | INHALATION_SPRAY | Freq: Every day | RESPIRATORY_TRACT | 5 refills | Status: AC

## 2023-11-20 NOTE — Patient Instructions (Signed)
 Brett Olson

## 2023-11-20 NOTE — Progress Notes (Signed)
 Virtual Visit via Video Note  I connected with Brett Olson on 11/20/23 at  4:00 PM EDT by a video enabled telemedicine application and verified that I am speaking with the correct person using two identifiers.  Location: Patient: Home Provider: Office    I discussed the limitations of evaluation and management by telemedicine and the availability of in person appointments. The patient expressed understanding and agreed to proceed.  History of Present Illness: 58 year old male, former smoker. PMH significant for CAD, ILD, PSA, GERD, hypothyroidism.   Previous [facility] pulmonary encounter:  ROV 09/16/2023 --follow-up visit 58 year old gentleman with history of former tobacco use, non-Hodgkin's lymphoma, hypertension, CAD/MI, influenza and pneumonia in February 2025, suspected OSA.  I saw him for shortness of breath in April.  We planned to repeat his CT scan of the chest to evaluate for resolution of his pulmonary infiltrates from February.  He also underwent a repeat PSG and pulmonary function testing He believes that he is still experiencing exertional SOB.  His family sees him struggling for breath.  He does remain quite active.  Pulmonary function testing 08/18/2023 reviewed by me shows evidence for mixed restriction and obstruction with a moderately severe decrease in FEV1 at 58% predicted, restricted lung volumes and a decreased diffusion capacity that corrects to normal when adjusted for alveolar volume.  CT chest 08/12/2023 reviewed by me shows some paramediastinal fibrotic change and bronchiectasis in the upper lobes, some subpleural reticulation and associated slight bronchiolectasis.  The patchy groundglass infiltrates have resolved.   11/20/2023 Discussed the use of AI scribe software for clinical note transcription with the patient, who gave verbal consent to proceed.  History of Present Illness Brett Olson is a 58 year old male with severe sleep apnea who presents for  evaluation of severe sleep apnea. He was referred by Dr. Ruther for evaluation of severe sleep apnea.  He has a history of interstitial lung disease and COPD, for which he is currently using Stiolto once daily. He has about ten days left of his current supply and notes that his breathing is 'doing okay' with the medication, although he still experiences shortness of breath with exertion.  A home sleep study conducted at the end of June showed an average of forty apneas per hour during the analyzed sleep time of 118 minutes. He experienced seventy-eight hypopneic events, characterized by shallow breathing and oxygen desaturation. He has symptoms of snoring and waking up gasping or choking, which occur quite often.  He has previously been unable to tolerate CPAP due to discomfort with the mask and is interested in exploring alternative treatments for his sleep apnea.  He is a former smoker, having quit smoking ten years ago.    Observations/Objective:  Appears well without overt respiratory symptoms  Assessment and Plan:  1. Chronic obstructive pulmonary disease, unspecified COPD type (HCC) (Primary)  2. OSA (obstructive sleep apnea)  Assessment and Plan Assessment & Plan Severe obstructive sleep apnea Severe obstructive sleep apnea confirmed by home sleep study with an average of 40 apneas per hour and 78 hypopneic events during 118 minutes of analyzed sleep time. Symptoms include snoring, gasping, and choking during sleep. CPAP is the gold standard treatment but previously intolerant. Interested in the [place] device. Risks of Inspire include infection, bleeding, swelling, vocal changes, and trouble swallowing. No long-term restrictions anticipated with Inspire. BMI is 34. - Send a message to Dr. Jude to schedule sleep endoscopy to assess candidacy for Inspire.  Chronic obstructive pulmonary  disease (COPD) COPD managed with LABA/LAMAt. Reports shortness of breath with exertion. Former  smoker, quit 10 years ago. Uncertain if inahler has improved symptoms. Stiolto not covered, [employer] fast streamer sent to pharmacy - Continue Anoro once daily   Follow Up Instructions:   FU in 6 months with Dr. Byrum for COPD/ILD   I discussed the assessment and treatment plan with the patient. The patient was provided an opportunity to ask questions and all were answered. The patient agreed with the plan and demonstrated an understanding of the instructions.   The patient was advised to call back or seek an in-person evaluation if the symptoms worsen or if the condition fails to improve as anticipated.  I provided 22 minutes of non-face-to-face time during this encounter.   Brett LELON Ferrari, NP

## 2023-11-20 NOTE — H&P (View-Only) (Signed)
 Virtual Visit via Video Note  I connected with Brett Olson on 11/20/23 at  4:00 PM EDT by a video enabled telemedicine application and verified that I am speaking with the correct person using two identifiers.  Location: Patient: Home Provider: Office    I discussed the limitations of evaluation and management by telemedicine and the availability of in person appointments. The patient expressed understanding and agreed to proceed.  History of Present Illness: 58 year old male, former smoker. PMH significant for CAD, ILD, PSA, GERD, hypothyroidism.   Previous [facility] pulmonary encounter:  ROV 09/16/2023 --follow-up visit 58 year old gentleman with history of former tobacco use, non-Hodgkin's lymphoma, hypertension, CAD/MI, influenza and pneumonia in February 2025, suspected OSA.  I saw him for shortness of breath in April.  We planned to repeat his CT scan of the chest to evaluate for resolution of his pulmonary infiltrates from February.  He also underwent a repeat PSG and pulmonary function testing He believes that he is still experiencing exertional SOB.  His family sees him struggling for breath.  He does remain quite active.  Pulmonary function testing 08/18/2023 reviewed by me shows evidence for mixed restriction and obstruction with a moderately severe decrease in FEV1 at 58% predicted, restricted lung volumes and a decreased diffusion capacity that corrects to normal when adjusted for alveolar volume.  CT chest 08/12/2023 reviewed by me shows some paramediastinal fibrotic change and bronchiectasis in the upper lobes, some subpleural reticulation and associated slight bronchiolectasis.  The patchy groundglass infiltrates have resolved.   11/20/2023 Discussed the use of AI scribe software for clinical note transcription with the patient, who gave verbal consent to proceed.  History of Present Illness Brett Olson is a 58 year old male with severe sleep apnea who presents for  evaluation of severe sleep apnea. He was referred by Dr. Ruther for evaluation of severe sleep apnea.  He has a history of interstitial lung disease and COPD, for which he is currently using Stiolto once daily. He has about ten days left of his current supply and notes that his breathing is 'doing okay' with the medication, although he still experiences shortness of breath with exertion.  A home sleep study conducted at the end of June showed an average of forty apneas per hour during the analyzed sleep time of 118 minutes. He experienced seventy-eight hypopneic events, characterized by shallow breathing and oxygen desaturation. He has symptoms of snoring and waking up gasping or choking, which occur quite often.  He has previously been unable to tolerate CPAP due to discomfort with the mask and is interested in exploring alternative treatments for his sleep apnea.  He is a former smoker, having quit smoking ten years ago.    Observations/Objective:  Appears well without overt respiratory symptoms  Assessment and Plan:  1. Chronic obstructive pulmonary disease, unspecified COPD type (HCC) (Primary)  2. OSA (obstructive sleep apnea)  Assessment and Plan Assessment & Plan Severe obstructive sleep apnea Severe obstructive sleep apnea confirmed by home sleep study with an average of 40 apneas per hour and 78 hypopneic events during 118 minutes of analyzed sleep time. Symptoms include snoring, gasping, and choking during sleep. CPAP is the gold standard treatment but previously intolerant. Interested in the [place] device. Risks of Inspire include infection, bleeding, swelling, vocal changes, and trouble swallowing. No long-term restrictions anticipated with Inspire. BMI is 34. - Send a message to Dr. Jude to schedule sleep endoscopy to assess candidacy for Inspire.  Chronic obstructive pulmonary  disease (COPD) COPD managed with LABA/LAMAt. Reports shortness of breath with exertion. Former  smoker, quit 10 years ago. Uncertain if inahler has improved symptoms. Stiolto not covered, [employer] fast streamer sent to pharmacy - Continue Anoro once daily   Follow Up Instructions:   FU in 6 months with Dr. Byrum for COPD/ILD   I discussed the assessment and treatment plan with the patient. The patient was provided an opportunity to ask questions and all were answered. The patient agreed with the plan and demonstrated an understanding of the instructions.   The patient was advised to call back or seek an in-person evaluation if the symptoms worsen or if the condition fails to improve as anticipated.  I provided 22 minutes of non-face-to-face time during this encounter.   Almarie LELON Ferrari, NP

## 2023-11-20 NOTE — Telephone Encounter (Signed)
 Patient of Dr. Shelah, he has severe sleep apnea and is intolerant to CPAP. HST 09/27/33 AHI 40/hour with spo2 low 81% (analyzed time 118 mins). Patient is interested in [place]. BMI 34. Can we set him up for DISE procedure?

## 2023-12-03 NOTE — Telephone Encounter (Signed)
 He is agreeable to proceed with DISE Would like to schedule on 9/17 @ 8am OK to stay on brilinta    Please schedule the following:  Provider performing procedure:Jevon Shells Diagnosis: OSA Procedure: DISE  Has patient been spoken to by Provider and given informed consent? Yes Anesthesia: propofol  Date: 9/17 Alternate Date: 9/16  Time: AM 8 Location: [facility] Does patient have Latex allergy ? NA Medication Restriction/ Anticoagulate/Antiplatelet: NA Is patient on GLP-1 agonist? NA, must hold for procedure. Pre-op Labs Ordered:determined by Anesthesia  Please coordinate Pre-op COVID Testing

## 2023-12-04 ENCOUNTER — Encounter: Payer: Self-pay | Admitting: Pulmonary Disease

## 2023-12-04 NOTE — Telephone Encounter (Signed)
 Thank you for the update. Confirmed with INSPIRE reps for upcoming procedure. Please advise if anything further is needed.

## 2023-12-04 NOTE — Telephone Encounter (Signed)
 Patient scheduled on 9/16 at 7:30am for their dise procedure. Patient is aware of the appointment and mailing letter.

## 2023-12-09 NOTE — Telephone Encounter (Signed)
 Contacted Brett Olson confirmed DISE on 9/16 rescheduled to 8:30 AM with 7:30 arrival. INSPIRE reps notified and updated. Nothing further action required.

## 2023-12-15 ENCOUNTER — Other Ambulatory Visit: Payer: Self-pay

## 2023-12-15 ENCOUNTER — Telehealth: Payer: Self-pay | Admitting: Pulmonary Disease

## 2023-12-15 ENCOUNTER — Encounter (HOSPITAL_COMMUNITY)
Admission: RE | Disposition: A | Discharge status: Home / Self Care | Payer: Self-pay | Source: Home / Self Care | Attending: Pulmonary Disease

## 2023-12-15 ENCOUNTER — Ambulatory Visit (HOSPITAL_COMMUNITY)
Admission: RE | Admit: 2023-12-15 | Discharge: 2023-12-15 | Disposition: A | Discharge status: Home / Self Care | Attending: Pulmonary Disease | Admitting: Pulmonary Disease

## 2023-12-15 ENCOUNTER — Ambulatory Visit (HOSPITAL_BASED_OUTPATIENT_CLINIC_OR_DEPARTMENT_OTHER): Payer: Self-pay | Admitting: Certified Registered Nurse Anesthetist

## 2023-12-15 ENCOUNTER — Encounter (HOSPITAL_COMMUNITY): Payer: Self-pay | Admitting: Pulmonary Disease

## 2023-12-15 ENCOUNTER — Telehealth: Payer: Self-pay | Admitting: Internal Medicine

## 2023-12-15 ENCOUNTER — Ambulatory Visit (HOSPITAL_COMMUNITY): Payer: Self-pay | Admitting: Certified Registered Nurse Anesthetist

## 2023-12-15 DIAGNOSIS — K219 Gastro-esophageal reflux disease without esophagitis: Secondary | ICD-10-CM | POA: Diagnosis not present

## 2023-12-15 DIAGNOSIS — Z87891 Personal history of nicotine dependence: Secondary | ICD-10-CM | POA: Diagnosis not present

## 2023-12-15 DIAGNOSIS — I2510 Atherosclerotic heart disease of native coronary artery without angina pectoris: Secondary | ICD-10-CM | POA: Insufficient documentation

## 2023-12-15 DIAGNOSIS — Z79899 Other long term (current) drug therapy: Secondary | ICD-10-CM | POA: Insufficient documentation

## 2023-12-15 DIAGNOSIS — Z87820 Personal history of traumatic brain injury: Secondary | ICD-10-CM | POA: Diagnosis not present

## 2023-12-15 DIAGNOSIS — G4733 Obstructive sleep apnea (adult) (pediatric): Secondary | ICD-10-CM | POA: Diagnosis present

## 2023-12-15 DIAGNOSIS — J449 Chronic obstructive pulmonary disease, unspecified: Secondary | ICD-10-CM | POA: Diagnosis not present

## 2023-12-15 DIAGNOSIS — I10 Essential (primary) hypertension: Secondary | ICD-10-CM | POA: Diagnosis not present

## 2023-12-15 DIAGNOSIS — I252 Old myocardial infarction: Secondary | ICD-10-CM | POA: Diagnosis not present

## 2023-12-15 HISTORY — PX: DRUG INDUCED ENDOSCOPY: SHX6808

## 2023-12-15 SURGERY — DRUG INDUCED SLEEP ENDOSCOPY
Anesthesia: Monitor Anesthesia Care

## 2023-12-15 MED ORDER — PROPOFOL 500 MG/50ML IV EMUL
INTRAVENOUS | Status: DC | PRN
  Administered 2023-12-15: 100 ug/kg/min via INTRAVENOUS

## 2023-12-15 MED ORDER — OXYMETAZOLINE HCL 0.05 % NA SOLN
NASAL | Status: DC | PRN
  Administered 2023-12-15: 1

## 2023-12-15 MED ORDER — SODIUM CHLORIDE 0.9 % IV SOLN: INTRAVENOUS | Status: DC | PRN

## 2023-12-15 MED ORDER — OXYMETAZOLINE HCL 0.05 % NA SOLN
NASAL | Status: AC
  Filled 2023-12-15: qty 15

## 2023-12-15 NOTE — Telephone Encounter (Signed)
 Based on DISE< he qualifies for inspire But his sleep hygiene is so poor, that I would hold off until we can work with him some more on this. He is also working on wt loss with GLP-1 with his PCP Please make a FU appt with me in 4-6 wks to discuss inspire therapy

## 2023-12-15 NOTE — Telephone Encounter (Signed)
 Pt notified Can you call him to schedule please?

## 2023-12-15 NOTE — Anesthesia Preprocedure Evaluation (Addendum)
 Anesthesia Evaluation  Patient identified by MRN, date of birth, ID band Patient awake    Reviewed: Allergy  & Precautions, NPO status , Patient's Chart, lab work & pertinent test results  Airway Mallampati: II  TM Distance: >3 FB Neck ROM: Full    Dental  (+) Edentulous Upper   Pulmonary sleep apnea , COPD,  COPD inhaler, former smoker   Pulmonary exam normal        Cardiovascular hypertension, Pt. on home beta blockers and Pt. on medications + CAD and + Past MI  Normal cardiovascular exam     Neuro/Psych   Anxiety     TBI (traumatic brain injury)    GI/Hepatic Neg liver ROS,GERD  Medicated and Controlled,,  Endo/Other  Hypothyroidism    Renal/GU negative Renal ROS     Musculoskeletal   Abdominal  (+) + obese  Peds  Hematology negative hematology ROS (+)   Anesthesia Other Findings OSA (obstructive sleep apnea)  Reproductive/Obstetrics                              Anesthesia Physical Anesthesia Plan  ASA: 3  Anesthesia Plan: MAC   Post-op Pain Management:    Induction:   PONV Risk Score and Plan: 1 and Propofol  infusion and Treatment may vary due to age or medical condition  Airway Management Planned: Nasal Cannula  Additional Equipment:   Intra-op Plan:   Post-operative Plan:   Informed Consent: I have reviewed the patients History and Physical, chart, labs and discussed the procedure including the risks, benefits and alternatives for the proposed anesthesia with the patient or authorized representative who has indicated his/her understanding and acceptance.     Dental advisory given  Plan Discussed with: CRNA  Anesthesia Plan Comments:         Anesthesia Quick Evaluation

## 2023-12-15 NOTE — Telephone Encounter (Signed)
 Ok, he will need an office visit.

## 2023-12-15 NOTE — Interval H&P Note (Signed)
 History and Physical Interval Note:  12/15/2023 8:38 AM  Brett Olson  has presented today for surgery, with the diagnosis of OSA.  The various methods of treatment have been discussed with the patient and family. After consideration of risks, benefits and other options for treatment, the patient has consented to  Procedure(s): DRUG INDUCED SLEEP ENDOSCOPY (N/A) as a surgical intervention.  The patient's history has been reviewed, patient examined, no change in status, stable for surgery.  I have reviewed the patient's chart and labs.  Questions were answered to the patient's satisfaction.     Harden ROCKFORD Tyechia Allmendinger

## 2023-12-15 NOTE — Transfer of Care (Signed)
 Immediate Anesthesia Transfer of Care Note  Patient: Brett Olson  Procedure(s) Performed: DRUG INDUCED SLEEP ENDOSCOPY  Patient Location: Endoscopy Unit  Anesthesia Type:MAC  Level of Consciousness: awake, alert , and oriented  Airway & Oxygen Therapy: Patient Spontanous Breathing  Post-op Assessment: Report given to RN, Post -op Vital signs reviewed and stable, Patient moving all extremities X 4, and Patient able to stick tongue midline  Post vital signs: Reviewed and stable  Last Vitals:  Vitals Value Taken Time  BP 101/64 12/15/23 09:32  Temp 98.6   Pulse 86 12/15/23 09:34  Resp 14 12/15/23 09:34  SpO2 90 % 12/15/23 09:34  Vitals shown include unfiled device data.  Last Pain:  Vitals:   12/15/23 0746  TempSrc: Temporal  PainSc: 0-No pain         Complications: No notable events documented.

## 2023-12-15 NOTE — Op Note (Signed)
 Procedure: Evaluation of sleep-disordered breathing by examination of upper airway using an endoscope  CPT Codes: 57024 Evaluation of sleep-disordered breathing by examination of upper airway using an endoscope  Pre-Op Diagnose: Moderate /Severe obstructive sleep apnea with positive airway pressure intolerance (ICD-10 G47.33).  Post-Op Diagnosis: Moderate /Severe obstructive sleep apnea with positive pressure airway intolerance (ICD-10 G47.33).  ANESTHESIA: IV sedation with propofol   ESTIMATED BLOOD LOSS: None.  COMPLICATIONS: None.  BRIEF CLINICAL HISTORY: This is a 58 year old patient with a history of moderate to severe symptomatic obstructive sleep apnea, who is intolerant and unable to achieve benefit with positive pressure therapy.He  presents today for drug-induced sleep endoscopy to better characterize her locations and pattern of obstruction and to predict appropriate medical and/or surgical options moving forward.  PROCEDURE FINDINGS: There was no evidence of complete concentric palatal obstruction and he is a candidate anatomically for hypoglossal nerve stimulation therapy.  DESCRIPTION OF PROCEDURE: The patient was brought to the endoscopy room and was anesthetized via the standard drug-induced sleep endoscopy protocol. The propofol  infusion rate was started at 75 mcg and gradually increased at which point, conditions that mimic sleep were gradually observed.   With the patient not responsive to verbal commands, but still with spontaneous respiration, sleep disordered breathing events and associated desaturations were clearly observed  Under these conditions, the flexible endoscope was inserted to examine both sides of the nose as well as the pharynx and larynx.  The VOTE score at baseline was complete AP , complete AP , complete AP, completeAP.  With simulated jaw advancement and tongue advancement, the hypopharyngeal obstruction and secondarily the palatal collapse also  improved.  In summary, there was no evidence of complete concentric palatal obstruction and he is a candidate anatomically for hypoglossal nerve stimulation therapy.  I was present for and performed the entire procedure.  Dictated By: Brett ROCKFORD Jude MD  Post-Op Plan: Work with him to improve sleep hygiene - he reports numerous sleep interruptions EN evaluation for implantation  Diagnostic Codes: G47.33 Obstructive sleep apnea (adult)    Brett Olson V. Jude MD

## 2023-12-15 NOTE — Discharge Instructions (Signed)
 You qualify for [facility] end[facility] But we should hold off until follow up appt & discuss /improve sleep hygiene some more before we go down that route

## 2023-12-15 NOTE — Telephone Encounter (Signed)
 Patient's PCP office, Tylene Corrigan, PA, called to see when the last colonoscopy was done and when would he be due again.  I looked at the reports and the Path reports states to have done again 3 years from 2019.  The patient saw his PCP yesterday and he does want to proceed with getting scheduled for a colonoscopy.  Please advise.

## 2023-12-16 ENCOUNTER — Other Ambulatory Visit: Payer: Self-pay | Admitting: Internal Medicine

## 2023-12-16 ENCOUNTER — Encounter (HOSPITAL_COMMUNITY): Payer: Self-pay | Admitting: Pulmonary Disease

## 2023-12-16 NOTE — Anesthesia Postprocedure Evaluation (Signed)
 Anesthesia Post Note  Patient: Brett Olson  Procedure(s) Performed: DRUG INDUCED SLEEP ENDOSCOPY     Patient location during evaluation: Endoscopy Anesthesia Type: MAC Level of consciousness: awake Pain management: pain level controlled Vital Signs Assessment: post-procedure vital signs reviewed and stable Respiratory status: spontaneous breathing, nonlabored ventilation and respiratory function stable Cardiovascular status: blood pressure returned to baseline and stable Postop Assessment: no apparent nausea or vomiting Anesthetic complications: no   No notable events documented.  Last Vitals:  Vitals:   12/15/23 0940 12/15/23 0951  BP: 113/74 128/88  Pulse: 81 76  Resp: 13 13  Temp:    SpO2: 94% 93%    Last Pain:  Vitals:   12/15/23 0951  TempSrc:   PainSc: 0-No pain                 Supreme Rybarczyk P Emmauel Hallums

## 2023-12-17 NOTE — Telephone Encounter (Signed)
 noted

## 2023-12-30 ENCOUNTER — Ambulatory Visit: Admitting: Primary Care

## 2024-01-12 ENCOUNTER — Telehealth: Payer: Self-pay | Admitting: *Deleted

## 2024-01-12 ENCOUNTER — Ambulatory Visit: Admitting: Internal Medicine

## 2024-01-12 VITALS — BP 129/82 | HR 79 | Temp 98.4°F | Ht 74.0 in | Wt 277.0 lb

## 2024-01-12 DIAGNOSIS — Z860101 Personal history of adenomatous and serrated colon polyps: Secondary | ICD-10-CM | POA: Diagnosis not present

## 2024-01-12 DIAGNOSIS — Z1211 Encounter for screening for malignant neoplasm of colon: Secondary | ICD-10-CM | POA: Diagnosis not present

## 2024-01-12 DIAGNOSIS — Z860100 Personal history of colon polyps, unspecified: Secondary | ICD-10-CM

## 2024-01-12 MED ORDER — PEG 3350-KCL-NA BICARB-NACL 420 G PO SOLR
4000.0000 mL | Freq: Once | ORAL | 0 refills | Status: AC
Start: 2024-01-12 — End: 2024-01-12
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
## 2024-01-12 NOTE — Patient Instructions (Addendum)
 Nice to see you again today!  As discussed you are due for a colonoscopy.  We will set up a colonoscopy at a time that we will see you between now and Thanksgiving.  You will need to come off of Brilinta  for 3 days and the Zepbound per protocol.  ASA 3 in room 3  Further recommendations to follow.

## 2024-01-12 NOTE — Telephone Encounter (Signed)
 Spoke with pt. He has been scheduled for colonoscopy with Dr. Shaaron, ASA 3 on 10/24. He is aware when to hold zepbound and brilinta . He needs good prep.

## 2024-01-15 ENCOUNTER — Ambulatory Visit (INDEPENDENT_AMBULATORY_CARE_PROVIDER_SITE_OTHER): Admitting: Pulmonary Disease

## 2024-01-15 ENCOUNTER — Encounter (HOSPITAL_BASED_OUTPATIENT_CLINIC_OR_DEPARTMENT_OTHER): Payer: Self-pay | Admitting: Pulmonary Disease

## 2024-01-15 VITALS — BP 136/87 | HR 83 | Ht 74.0 in | Wt 273.8 lb

## 2024-01-15 DIAGNOSIS — E669 Obesity, unspecified: Secondary | ICD-10-CM

## 2024-01-15 DIAGNOSIS — J449 Chronic obstructive pulmonary disease, unspecified: Secondary | ICD-10-CM | POA: Diagnosis not present

## 2024-01-15 DIAGNOSIS — G4733 Obstructive sleep apnea (adult) (pediatric): Secondary | ICD-10-CM | POA: Diagnosis not present

## 2024-01-15 DIAGNOSIS — R600 Localized edema: Secondary | ICD-10-CM | POA: Diagnosis not present

## 2024-01-15 NOTE — Progress Notes (Signed)
 Subjective:    Patient ID: Brett Olson, male    DOB: 07/03/1965, 58 y.o.   MRN: [id number]  58 yo for FU of OSA Sees RB for COPD/ILD  PMH : non-Hodgkin's lymphoma,  hypertension,  CAD/MI,  influenza and pneumonia in February 2025    Discussed the use of AI scribe software for clinical note transcription with the patient, who gave verbal consent to proceed.  History of Present Illness Brett Olson is a 58 year old male with obstructive sleep apnea who presents for follow-up.  He has an apnea-hypopnea index of 40 events per hour. Drug-induced sleep endoscopy was performed. He wakes up every hour due to snoring and has difficulty using CPAP, tolerating it for only two hours during a home study. Frequent position changes during sleep due to shoulder pain lead him to sleep in a recliner.  He is actively working on weight loss and has started taking Zepbound to improve his OSA symptoms. He reports some improvement in sleep quality, stating he is sleeping better and not waking up every half hour.  He has COPD and lung scarring from past radiation therapy. He experiences fluid retention, particularly in his legs, and an ultrasound and labs were conducted to rule out blood clots. A new medication was added to manage the fluid retention.    Based on DISE< he qualifies for inspire But his sleep hygiene is so poor, that I would hold off until we can work with him some more on this. 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 This has a mild upper zonal  predominance. Findings are consistent with fibrosis   HST 08/2023 AHI 40/hour with spo2 low 81% (analyzed time 118 mins).    Review of Systems  neg for any significant sore throat, dysphagia, itching, sneezing, nasal congestion or excess/ purulent secretions, fever, chills, sweats, unintended wt loss, pleuritic or exertional cp, hempoptysis, orthopnea pnd or change in chronic leg swelling. Also denies presyncope, palpitations, heartburn, abdominal pain, nausea, vomiting, diarrhea or change in bowel or urinary habits, dysuria,hematuria, rash, arthralgias, visual complaints, headache, numbness weakness or ataxia.      Objective:   Physical Exam  Gen. Pleasant, obese, in no distress ENT - no lesions, no post nasal drip Neck: No JVD, no thyromegaly, no carotid bruits Lungs: no use of accessory muscles, no dullness to percussion, decreased without rales or rhonchi  Cardiovascular: Rhythm regular, heart sounds  normal, no murmurs or gallops, 2+ peripheral edema Musculoskeletal: No deformities, no cyanosis or clubbing , no tremors       Assessment & Plan:   Assessment and Plan Assessment & Plan Obstructive sleep apnea Severe obstructive sleep apnea with an AHI of 40 events per hour. CPAP intolerance due to discomfort and inability to tolerate facial masks. Considering Inspire therapy, but concerns about weight and sleep hygiene affecting efficacy. Drug-induced sleep endoscopy confirmed airway closure without concentric collapse. - Refer to ENT for [facility] therapy evaluation and potential implantation. - Discuss sleep hygiene improvements. - Educate on the [facility] therapy process, including surgical procedure, post-operative adjustments, and expected outcomes.  Obesity Obesity with  a BMI likely above 35, contributing to obstructive sleep apnea severity. Weight loss is recommended to improve the efficacy of Inspire therapy and overall health. Currently on Zepbound for weight management,  with recent initiation of therapy. Encouraged to lose 20 pounds to enhance surgical outcomes and reduce apnea severity. - Continue Zepbound for weight management. - Encourage weight loss of 20 pounds before potential Inspire therapy in December.  Chronic obstructive pulmonary disease (COPD) COPD with past radiation-induced lung scarring. Weight loss anticipated to improve respiratory function and overall health. - Encourage weight loss to improve respiratory function.  Lower extremity edema Bilateral lower extremity edema, likely exacerbated by amlodipine . Recent ultrasound and labs performed by primary care to rule out blood clots. New medication added to manage fluid retention. Edema may contribute to overall weight and complicate weight loss efforts. - Continue diuretic for fluid management as prescribed by primary care.

## 2024-01-15 NOTE — Patient Instructions (Addendum)
 X refer to ENT  X weight loss 15-20 lbs recommended  Call us  once you have surgery date    VISIT SUMMARY: Today, you had a follow-up appointment to discuss your obstructive sleep apnea, weight management, COPD, and lower extremity edema. We reviewed your current treatments and discussed potential new therapies to improve your health and quality of sleep.  YOUR PLAN: -OBSTRUCTIVE SLEEP APNEA: Obstructive sleep apnea is a condition where your airway becomes blocked during sleep, causing breathing interruptions. Your apnea-hypopnea index (AHI) is 40 events per hour, indicating severe sleep apnea. Due to difficulty tolerating CPAP, we are considering Inspire therapy, which involves a surgical procedure to implant a device that helps keep your airway open. You will be referred to an ENT specialist for evaluation. We also discussed ways to improve your sleep habits.  -OBESITY: Obesity is a condition where excess body fat negatively affects your health. Your weight is contributing to the severity of your sleep apnea. You are currently taking Zepbound to help with weight management. We encourage you to lose 20 pounds before considering Inspire therapy in December, as this will improve the effectiveness of the treatment and your overall health.   -LOWER EXTREMITY EDEMA: Lower extremity edema is swelling in your legs, which can be caused by fluid retention. This condition is likely worsened by your current medication, amlodipine . You have started a new medication to help manage the fluid retention. Continue taking this new medication as prescribed by your primary care doctor.  INSTRUCTIONS: Please follow up with an ENT specialist for an evaluation of Inspire therapy. Continue taking Zepbound for weight management and aim to lose 20 pounds before December. Keep taking the new medication for fluid retention as prescribed. If you have any concerns or experience any new symptoms, please contact our  office.                      Contains text generated by Abridge.                                 Contains text generated by Abridge.

## 2024-01-19 ENCOUNTER — Encounter (HOSPITAL_COMMUNITY)
Admission: RE | Admit: 2024-01-19 | Discharge: 2024-01-19 | Disposition: A | Discharge status: Home / Self Care | Source: Ambulatory Visit | Attending: Internal Medicine | Admitting: Internal Medicine

## 2024-01-19 ENCOUNTER — Other Ambulatory Visit: Payer: Self-pay

## 2024-01-19 ENCOUNTER — Encounter (HOSPITAL_COMMUNITY): Payer: Self-pay

## 2024-01-19 HISTORY — DX: Chronic obstructive pulmonary disease, unspecified: J44.9

## 2024-01-19 NOTE — Pre-Procedure Instructions (Signed)
 Did pre-op phone call with patient and wife. Wife states that patient is on Zepbound for weight loss and sleep apnea, he is not diabetic. He just started this. He has been approved for an inspire, if needed but they are hoping the zepbound will work. He has not had a CPAP or Oxygen yet in hopes that this treatment will work.

## 2024-01-22 ENCOUNTER — Other Ambulatory Visit: Payer: Self-pay

## 2024-01-22 ENCOUNTER — Ambulatory Visit (HOSPITAL_COMMUNITY): Admitting: Anesthesiology

## 2024-01-22 ENCOUNTER — Encounter (HOSPITAL_COMMUNITY): Admission: RE | Disposition: A | Payer: Self-pay | Source: Home / Self Care | Attending: Internal Medicine

## 2024-01-22 ENCOUNTER — Encounter (HOSPITAL_COMMUNITY): Payer: Self-pay | Admitting: Internal Medicine

## 2024-01-22 ENCOUNTER — Ambulatory Visit (HOSPITAL_COMMUNITY)
Admission: RE | Admit: 2024-01-22 | Discharge: 2024-01-22 | Disposition: A | Discharge status: Home / Self Care | Attending: Internal Medicine | Admitting: Internal Medicine

## 2024-01-22 DIAGNOSIS — Z87891 Personal history of nicotine dependence: Secondary | ICD-10-CM | POA: Diagnosis not present

## 2024-01-22 DIAGNOSIS — D128 Benign neoplasm of rectum: Secondary | ICD-10-CM | POA: Diagnosis not present

## 2024-01-22 DIAGNOSIS — Z1211 Encounter for screening for malignant neoplasm of colon: Secondary | ICD-10-CM | POA: Insufficient documentation

## 2024-01-22 DIAGNOSIS — K5730 Diverticulosis of large intestine without perforation or abscess without bleeding: Secondary | ICD-10-CM

## 2024-01-22 DIAGNOSIS — D123 Benign neoplasm of transverse colon: Secondary | ICD-10-CM | POA: Insufficient documentation

## 2024-01-22 DIAGNOSIS — I10 Essential (primary) hypertension: Secondary | ICD-10-CM | POA: Diagnosis not present

## 2024-01-22 DIAGNOSIS — Z860101 Personal history of adenomatous and serrated colon polyps: Secondary | ICD-10-CM

## 2024-01-22 DIAGNOSIS — Z7901 Long term (current) use of anticoagulants: Secondary | ICD-10-CM | POA: Diagnosis not present

## 2024-01-22 DIAGNOSIS — Z79899 Other long term (current) drug therapy: Secondary | ICD-10-CM | POA: Diagnosis not present

## 2024-01-22 DIAGNOSIS — D122 Benign neoplasm of ascending colon: Secondary | ICD-10-CM | POA: Diagnosis not present

## 2024-01-22 HISTORY — PX: COLONOSCOPY: SHX5424

## 2024-01-22 SURGERY — COLONOSCOPY
Anesthesia: General

## 2024-01-22 MED ORDER — LIDOCAINE 2% (20 MG/ML) 5 ML SYRINGE
INTRAMUSCULAR | Status: DC | PRN
  Administered 2024-01-22: 60 mg via INTRAVENOUS

## 2024-01-22 MED ORDER — PROPOFOL 10 MG/ML IV BOLUS
INTRAVENOUS | Status: DC | PRN
  Administered 2024-01-22: 125 ug/kg/min via INTRAVENOUS
  Administered 2024-01-22: 30 mg via INTRAVENOUS
  Administered 2024-01-22: 80 mg via INTRAVENOUS
  Administered 2024-01-22 (×2): 30 mg via INTRAVENOUS

## 2024-01-22 MED ORDER — DEXMEDETOMIDINE HCL IN NACL 80 MCG/20ML IV SOLN
INTRAVENOUS | Status: DC | PRN
  Administered 2024-01-22: 6 ug via INTRAVENOUS

## 2024-01-22 MED ORDER — LACTATED RINGERS IV SOLN: INTRAVENOUS | Status: DC

## 2024-01-22 NOTE — Anesthesia Procedure Notes (Addendum)
 Date/Time: 01/22/2024 10:39 AM  Performed by: Para Jerelene CROME, CRNAOxygen Delivery Method: Nasal cannula

## 2024-01-22 NOTE — Discharge Instructions (Signed)
  Colonoscopy Discharge Instructions  Read the instructions outlined below and refer to this sheet in the next few weeks. These discharge instructions provide you with general information on caring for yourself after you leave the hospital. Your doctor may also give you specific instructions. While your treatment has been planned according to the most current medical practices available, unavoidable complications occasionally occur. If you have any problems or questions after discharge, call Dr. Shaaron at [phone]. ACTIVITY You may resume your regular activity, but move at a slower pace for the next 24 hours.  Take frequent rest periods for the next 24 hours.  Walking will help get rid of the air and reduce the bloated feeling in your belly (abdomen).  No driving for 24 hours (because of the medicine (anesthesia) used during the test).   Do not sign any important legal documents or operate any machinery for 24 hours (because of the anesthesia used during the test).  NUTRITION Drink plenty of fluids.  You may resume your normal diet as instructed by your doctor.  Begin with a light meal and progress to your normal diet. Heavy or fried foods are harder to digest and may make you feel sick to your stomach (nauseated).  Avoid alcoholic beverages for 24 hours or as instructed.  MEDICATIONS You may resume your normal medications unless your doctor tells you otherwise.  WHAT YOU CAN EXPECT TODAY Some feelings of bloating in the abdomen.  Passage of more gas than usual.  Spotting of blood in your stool or on the toilet paper.  IF YOU HAD POLYPS REMOVED DURING THE COLONOSCOPY: No aspirin  products for 7 days or as instructed.  No alcohol for 7 days or as instructed.  Eat a soft diet for the next 24 hours.  FINDING OUT THE RESULTS OF YOUR TEST Not all test results are available during your visit. If your test results are not back during the visit, make an appointment with your caregiver to find out the  results. Do not assume everything is normal if you have not heard from your caregiver or the medical facility. It is important for you to follow up on all of your test results.  SEEK IMMEDIATE MEDICAL ATTENTION IF: You have more than a spotting of blood in your stool.  Your belly is swollen (abdominal distention).  You are nauseated or vomiting.  You have a temperature over 101.  You have abdominal pain or discomfort that is severe or gets worse throughout the day.      multiple polyps found and removed today  Further recommendations to follow pending review of pathology report   resume Brilinta  on 01/24/24   at patient request, I called Lisa at [phone]-call rolled to voicemail.  Left a detailed message.

## 2024-01-22 NOTE — Op Note (Signed)
 [facility] Patient Name: Brett Olson Procedure Date: 01/22/2024 10:25 AM MRN: [id number] Date of Birth: [birth year] Attending MD: Lamar Ozell Hollingshead , MD, [id number] CSN: [id number] Age: 58 Admit Type: Outpatient Procedure:                Colonoscopy Indications:              High risk colon cancer surveillance: Personal                            history of colonic polyps Providers:                Lamar Ozell Hollingshead, MD, Madelin Hunter, RN, Dorcas Lenis, Technician Referring MD:              Medicines:                Propofol  per Anesthesia Complications:            No immediate complications. Estimated Blood Loss:     Estimated blood loss was minimal. Procedure:                Pre-Anesthesia Assessment:                           - Prior to the procedure, a History and Physical                            was performed, and patient medications and                            allergies were reviewed. The patient's tolerance of                            previous anesthesia was also reviewed. The risks                            and benefits of the procedure and the sedation                            options and risks were discussed with the patient.                            All questions were answered, and informed consent                            was obtained. Prior Anticoagulants: The patient has                            taken no anticoagulant or antiplatelet agents. ASA                            Grade Assessment: III - A patient with severe  systemic disease. After reviewing the risks and                            benefits, the patient was deemed in satisfactory                            condition to undergo the procedure.                           After obtaining informed consent, the colonoscope                            was passed under direct vision. Throughout the                            procedure, the  patient's blood pressure, pulse, and                            oxygen saturations were monitored continuously. The                            [id number] ([id number]) Colon was introduced through                            the anus and advanced to the the cecum, identified                            by appendiceal orifice and ileocecal valve. The                            colonoscopy was performed without difficulty. The                            patient tolerated the procedure well. The quality                            of the bowel preparation was adequate. The                            colonoscopy was performed without difficulty. The                            entire colon was well visualized. Scope In: 10:46:17 AM Scope Out: 11:03:36 AM Scope Withdrawal Time: 0 hours 10 minutes 9 seconds  Total Procedure Duration: 0 hours 17 minutes 19 seconds  Findings:      The perianal and digital rectal examinations were normal.      Four sessile polyps were found in the rectum and splenic flexure. The       polyps were 4 to 7 mm in size. These polyps were removed with a cold       snare. Resection and retrieval were complete. Estimated blood loss was       minimal.      Three sessile polyps were found in the rectum and ascending colon. The  polyps were 7 to 11 mm in size. These polyps were removed with a hot       snare. Resection and retrieval were complete. Estimated blood loss: none.      Scattered medium-mouthed diverticula were found in the sigmoid colon and       descending colon.      The exam was otherwise without abnormality on direct and retroflexion       views. Impression:               - Four 4 to 7 mm polyps in the rectum and at the                            splenic flexure, removed with a cold snare.                            Resected and retrieved.                           - Three 7 to 11 mm polyps in the rectum and in the                            ascending colon,  removed with a hot snare. Resected                            and retrieved.                           - Diverticulosis in the sigmoid colon and in the                            descending colon.                           - The examination was otherwise normal on direct                            and retroflexion views. Moderate Sedation:      Moderate (conscious) sedation was personally administered by an       anesthesia professional. The following parameters were monitored: oxygen       saturation, heart rate, blood pressure, respiratory rate, EKG, adequacy       of pulmonary ventilation, and response to care. Recommendation:           - Patient has a contact number available for                            emergencies. The signs and symptoms of potential                            delayed complications were discussed with the                            patient. Return to normal activities tomorrow.  Written discharge instructions were provided to the                            patient.                           - Advance diet as tolerated.                           - Continue present medications. Resume Brilinta                             01/24/2024                           - Repeat colonoscopy date to be determined after                            pending pathology results are reviewed for                            surveillance.                           - Return to GI office (date not yet determined). Procedure Code(s):        --- Professional ---                           [phone], Colonoscopy, flexible; with removal of                            tumor(s), polyp(s), or other lesion(s) by snare                            technique Diagnosis Code(s):        --- Professional ---                           Z86.010, Personal history of colonic polyps                           D12.3, Benign neoplasm of transverse colon (hepatic                             flexure or splenic flexure)                           D12.8, Benign neoplasm of rectum                           D12.2, Benign neoplasm of ascending colon                           K57.30, Diverticulosis of large intestine without                            perforation or abscess without bleeding CPT copyright  2022 American Medical Association. All rights reserved. The codes documented in this report are preliminary and upon coder review may  be revised to meet current compliance requirements. Lamar HERO. Chandani Rogowski, MD Lamar Ozell Hollingshead, MD 01/22/2024 12:09:22 PM This report has been signed electronically. Number of Addenda: 0

## 2024-01-22 NOTE — Transfer of Care (Signed)
 Immediate Anesthesia Transfer of Care Note  Patient: Brett Olson  Procedure(s) Performed: COLONOSCOPY  Patient Location: Short Stay  Anesthesia Type:General  Level of Consciousness: drowsy and patient cooperative  Airway & Oxygen Therapy: Patient Spontanous Breathing and Patient connected to nasal cannula oxygen  Post-op Assessment: Report given to RN and Post -op Vital signs reviewed and stable  Post vital signs: Reviewed and stable  Last Vitals:  Vitals Value Taken Time  BP 86/59 01/22/24 11:12  Temp 36.6 C 01/22/24 11:12  Pulse 97 01/22/24 11:12  Resp 20 01/22/24 11:12  SpO2 97 % 01/22/24 11:12    Last Pain:  Vitals:   01/22/24 1112  TempSrc: Oral  PainSc: 0-No pain         Complications: No notable events documented.

## 2024-01-22 NOTE — Anesthesia Preprocedure Evaluation (Signed)
 Anesthesia Evaluation  Patient identified by MRN, date of birth, ID band Patient awake    Reviewed: Allergy & Precautions, H&P , NPO status , Patient's Chart, lab work & pertinent test results, reviewed documented beta blocker date and time   Airway Mallampati: II  TM Distance: >3 FB Neck ROM: full    Dental no notable dental hx.    Pulmonary neg pulmonary ROS, former smoker   Pulmonary exam normal breath sounds clear to auscultation       Cardiovascular Exercise Tolerance: Good hypertension, negative cardio ROS  Rhythm:regular Rate:Normal     Neuro/Psych negative neurological ROS  negative psych ROS   GI/Hepatic negative GI ROS, Neg liver ROS,,,  Endo/Other  negative endocrine ROS    Renal/GU negative Renal ROS  negative genitourinary   Musculoskeletal   Abdominal   Peds  Hematology negative hematology ROS (+)   Anesthesia Other Findings   Reproductive/Obstetrics negative OB ROS                             Anesthesia Physical Anesthesia Plan  ASA: 3  Anesthesia Plan: General   Post-op Pain Management:    Induction:   PONV Risk Score and Plan: Propofol infusion  Airway Management Planned:   Additional Equipment:   Intra-op Plan:   Post-operative Plan:   Informed Consent: I have reviewed the patients History and Physical, chart, labs and discussed the procedure including the risks, benefits and alternatives for the proposed anesthesia with the patient or authorized representative who has indicated his/her understanding and acceptance.     Dental Advisory Given  Plan Discussed with: CRNA  Anesthesia Plan Comments:        Anesthesia Quick Evaluation

## 2024-01-22 NOTE — Anesthesia Procedure Notes (Signed)
 Date/Time: 01/22/2024 10:47 AM  Performed by: Para Jerelene CROME, CRNAOxygen Delivery Method: Non-rebreather mask

## 2024-01-22 NOTE — Interval H&P Note (Signed)
 History and Physical Interval Note:  01/22/2024 10:31 AM  Brett Olson  has presented today for surgery, with the diagnosis of HX POLYPS.  The various methods of treatment have been discussed with the patient and family. After consideration of risks, benefits and other options for treatment, the patient has consented to  Procedure(s) with comments: COLONOSCOPY (N/A) - 945AM, ASA 3 as a surgical intervention.  The patient's history has been reviewed, patient examined, no change in status, stable for surgery.  I have reviewed the patient's chart and labs.  Questions were answered to the patient's satisfaction.     Brett Olson   no change.  Surveillance colonoscopy per plan.  The risks, benefits, limitations, alternatives and imponderables have been reviewed with the patient. Potential for esophageal dilation, biopsy, etc. have also been reviewed.  Questions have been answered. All parties agreeable.

## 2024-01-22 NOTE — Anesthesia Postprocedure Evaluation (Signed)
 Anesthesia Post Note  Patient: Brett Olson  Procedure(s) Performed: COLONOSCOPY  Patient location during evaluation: Phase II Anesthesia Type: General Level of consciousness: awake Pain management: pain level controlled Vital Signs Assessment: post-procedure vital signs reviewed and stable Respiratory status: spontaneous breathing and respiratory function stable Cardiovascular status: blood pressure returned to baseline and stable Postop Assessment: no headache and no apparent nausea or vomiting Anesthetic complications: no Comments: Late entry   No notable events documented.   Last Vitals:  Vitals:   01/22/24 1112 01/22/24 1115  BP: (!) 86/59 99/69  Pulse: 97 91  Resp: 20 16  Temp: 36.6 C   SpO2: 97%     Last Pain:  Vitals:   01/22/24 1112  TempSrc: Oral  PainSc: 0-No pain                 Yvonna JINNY Bosworth

## 2024-01-25 ENCOUNTER — Encounter (HOSPITAL_COMMUNITY): Payer: Self-pay | Admitting: Internal Medicine

## 2024-01-25 LAB — SURGICAL PATHOLOGY

## 2024-01-26 ENCOUNTER — Ambulatory Visit: Payer: Self-pay | Admitting: Internal Medicine
